# Patient Record
Sex: Female | Born: 1935 | Hispanic: No | Smoking: Never smoker
Health system: Southern US, Community
[De-identification: ages and names within clinical notes are randomized; demographics above are authoritative.]

## PROBLEM LIST (undated history)

## (undated) DIAGNOSIS — N189 Chronic kidney disease, unspecified: Secondary | ICD-10-CM

## (undated) DIAGNOSIS — I509 Heart failure, unspecified: Secondary | ICD-10-CM

## (undated) DIAGNOSIS — G459 Transient cerebral ischemic attack, unspecified: Secondary | ICD-10-CM

## (undated) DIAGNOSIS — M1990 Unspecified osteoarthritis, unspecified site: Secondary | ICD-10-CM

## (undated) DIAGNOSIS — I10 Essential (primary) hypertension: Secondary | ICD-10-CM

## (undated) DIAGNOSIS — I739 Peripheral vascular disease, unspecified: Secondary | ICD-10-CM

## (undated) DIAGNOSIS — H409 Unspecified glaucoma: Secondary | ICD-10-CM

## (undated) DIAGNOSIS — F0280 Dementia in other diseases classified elsewhere without behavioral disturbance: Secondary | ICD-10-CM

## (undated) DIAGNOSIS — E039 Hypothyroidism, unspecified: Secondary | ICD-10-CM

## (undated) DIAGNOSIS — G309 Alzheimer's disease, unspecified: Secondary | ICD-10-CM

## (undated) DIAGNOSIS — E785 Hyperlipidemia, unspecified: Secondary | ICD-10-CM

## (undated) HISTORY — PX: ABDOMINAL HYSTERECTOMY: SHX81

## (undated) HISTORY — DX: Peripheral vascular disease, unspecified: I73.9

## (undated) HISTORY — PX: APPENDECTOMY: SHX54

## (undated) HISTORY — DX: Dementia in other diseases classified elsewhere, unspecified severity, without behavioral disturbance, psychotic disturbance, mood disturbance, and anxiety: F02.80

## (undated) HISTORY — DX: Essential (primary) hypertension: I10

## (undated) HISTORY — DX: Unspecified glaucoma: H40.9

## (undated) HISTORY — DX: Hyperlipidemia, unspecified: E78.5

## (undated) HISTORY — DX: Transient cerebral ischemic attack, unspecified: G45.9

## (undated) HISTORY — DX: Chronic kidney disease, unspecified: N18.9

## (undated) HISTORY — DX: Unspecified osteoarthritis, unspecified site: M19.90

## (undated) HISTORY — DX: Alzheimer's disease, unspecified: G30.9

## (undated) HISTORY — DX: Hypothyroidism, unspecified: E03.9

## (undated) HISTORY — DX: Heart failure, unspecified: I50.9

## (undated) HISTORY — PX: BYPASS GRAFT: SHX909

---

## 2004-03-03 ENCOUNTER — Ambulatory Visit: Payer: Self-pay | Admitting: Family Medicine

## 2004-03-10 ENCOUNTER — Ambulatory Visit: Payer: Self-pay | Admitting: Family Medicine

## 2006-03-18 ENCOUNTER — Ambulatory Visit: Payer: Self-pay | Admitting: Gastroenterology

## 2006-12-04 ENCOUNTER — Ambulatory Visit: Payer: Self-pay

## 2007-03-14 ENCOUNTER — Encounter: Payer: Self-pay | Admitting: Endocrinology

## 2007-04-19 ENCOUNTER — Ambulatory Visit: Payer: Self-pay | Admitting: Family Medicine

## 2007-04-28 ENCOUNTER — Encounter: Payer: Self-pay | Admitting: Endocrinology

## 2007-05-13 ENCOUNTER — Encounter: Payer: Self-pay | Admitting: Endocrinology

## 2007-05-13 ENCOUNTER — Ambulatory Visit: Payer: Self-pay | Admitting: Family Medicine

## 2007-06-02 ENCOUNTER — Ambulatory Visit: Payer: Self-pay | Admitting: Endocrinology

## 2007-06-02 DIAGNOSIS — I10 Essential (primary) hypertension: Secondary | ICD-10-CM | POA: Insufficient documentation

## 2007-06-02 DIAGNOSIS — E039 Hypothyroidism, unspecified: Secondary | ICD-10-CM | POA: Insufficient documentation

## 2007-06-02 DIAGNOSIS — E538 Deficiency of other specified B group vitamins: Secondary | ICD-10-CM

## 2007-06-02 DIAGNOSIS — E278 Other specified disorders of adrenal gland: Secondary | ICD-10-CM | POA: Insufficient documentation

## 2007-06-17 ENCOUNTER — Encounter: Payer: Self-pay | Admitting: Endocrinology

## 2007-07-05 ENCOUNTER — Ambulatory Visit: Payer: Self-pay | Admitting: Gastroenterology

## 2007-12-06 ENCOUNTER — Ambulatory Visit: Payer: Self-pay | Admitting: Internal Medicine

## 2008-01-05 ENCOUNTER — Ambulatory Visit: Payer: Self-pay | Admitting: Internal Medicine

## 2008-02-05 ENCOUNTER — Ambulatory Visit: Payer: Self-pay | Admitting: Internal Medicine

## 2008-02-08 ENCOUNTER — Ambulatory Visit: Payer: Self-pay | Admitting: Gastroenterology

## 2008-03-06 ENCOUNTER — Ambulatory Visit: Payer: Self-pay | Admitting: Internal Medicine

## 2008-04-06 ENCOUNTER — Ambulatory Visit: Payer: Self-pay | Admitting: Internal Medicine

## 2008-05-07 ENCOUNTER — Ambulatory Visit: Payer: Self-pay | Admitting: Internal Medicine

## 2008-06-04 ENCOUNTER — Ambulatory Visit: Payer: Self-pay | Admitting: Internal Medicine

## 2008-06-06 ENCOUNTER — Ambulatory Visit: Payer: Self-pay | Admitting: Internal Medicine

## 2008-07-05 ENCOUNTER — Ambulatory Visit: Payer: Self-pay | Admitting: Internal Medicine

## 2008-07-31 ENCOUNTER — Ambulatory Visit: Payer: Self-pay | Admitting: Vascular Surgery

## 2008-08-04 ENCOUNTER — Ambulatory Visit: Payer: Self-pay | Admitting: Internal Medicine

## 2008-09-04 ENCOUNTER — Ambulatory Visit: Payer: Self-pay | Admitting: Internal Medicine

## 2008-10-04 ENCOUNTER — Ambulatory Visit: Payer: Self-pay | Admitting: Internal Medicine

## 2008-11-04 ENCOUNTER — Ambulatory Visit: Payer: Self-pay | Admitting: Internal Medicine

## 2008-12-05 ENCOUNTER — Ambulatory Visit: Payer: Self-pay | Admitting: Internal Medicine

## 2009-01-04 ENCOUNTER — Ambulatory Visit: Payer: Self-pay | Admitting: Internal Medicine

## 2009-02-04 ENCOUNTER — Ambulatory Visit: Payer: Self-pay | Admitting: Internal Medicine

## 2009-03-13 ENCOUNTER — Ambulatory Visit: Payer: Self-pay | Admitting: Internal Medicine

## 2009-04-06 ENCOUNTER — Ambulatory Visit: Payer: Self-pay | Admitting: Internal Medicine

## 2009-05-07 ENCOUNTER — Ambulatory Visit: Payer: Self-pay | Admitting: Internal Medicine

## 2009-06-04 ENCOUNTER — Ambulatory Visit: Payer: Self-pay | Admitting: Internal Medicine

## 2009-06-05 ENCOUNTER — Ambulatory Visit: Payer: Self-pay | Admitting: Internal Medicine

## 2009-06-18 ENCOUNTER — Ambulatory Visit: Payer: Self-pay | Admitting: Internal Medicine

## 2009-07-05 ENCOUNTER — Ambulatory Visit: Payer: Self-pay | Admitting: Internal Medicine

## 2009-08-04 ENCOUNTER — Ambulatory Visit: Payer: Self-pay | Admitting: Internal Medicine

## 2009-09-04 ENCOUNTER — Ambulatory Visit: Payer: Self-pay | Admitting: Internal Medicine

## 2009-10-04 ENCOUNTER — Ambulatory Visit: Payer: Self-pay | Admitting: Internal Medicine

## 2009-11-04 ENCOUNTER — Ambulatory Visit: Payer: Self-pay | Admitting: Internal Medicine

## 2009-12-05 ENCOUNTER — Ambulatory Visit: Payer: Self-pay | Admitting: Internal Medicine

## 2010-01-09 ENCOUNTER — Ambulatory Visit: Payer: Self-pay | Admitting: Ophthalmology

## 2010-04-20 ENCOUNTER — Inpatient Hospital Stay: Payer: Self-pay | Admitting: Internal Medicine

## 2010-04-24 ENCOUNTER — Encounter: Payer: Self-pay | Admitting: Internal Medicine

## 2010-05-07 ENCOUNTER — Encounter: Payer: Self-pay | Admitting: Internal Medicine

## 2010-06-05 ENCOUNTER — Ambulatory Visit: Payer: Self-pay | Admitting: Internal Medicine

## 2010-07-06 ENCOUNTER — Ambulatory Visit: Payer: Self-pay | Admitting: Internal Medicine

## 2010-08-28 ENCOUNTER — Ambulatory Visit: Payer: Self-pay | Admitting: Internal Medicine

## 2010-09-05 ENCOUNTER — Ambulatory Visit: Payer: Self-pay | Admitting: Internal Medicine

## 2010-12-30 ENCOUNTER — Ambulatory Visit: Payer: Self-pay | Admitting: Internal Medicine

## 2011-01-05 ENCOUNTER — Ambulatory Visit: Payer: Self-pay | Admitting: Internal Medicine

## 2011-07-15 ENCOUNTER — Inpatient Hospital Stay: Payer: Self-pay | Admitting: Internal Medicine

## 2011-07-15 LAB — URINALYSIS, COMPLETE
Bacteria: NONE SEEN
Hyaline Cast: 1
Ketone: NEGATIVE
Nitrite: NEGATIVE
Ph: 5 (ref 4.5–8.0)
Specific Gravity: 1.013 (ref 1.003–1.030)
WBC UR: 9 /HPF (ref 0–5)

## 2011-07-15 LAB — CBC
HGB: 11.8 g/dL — ABNORMAL LOW (ref 12.0–16.0)
MCH: 31.3 pg (ref 26.0–34.0)
MCHC: 33.6 g/dL (ref 32.0–36.0)
RBC: 3.77 10*6/uL — ABNORMAL LOW (ref 3.80–5.20)
RDW: 14.6 % — ABNORMAL HIGH (ref 11.5–14.5)
WBC: 3.9 10*3/uL (ref 3.6–11.0)

## 2011-07-15 LAB — COMPREHENSIVE METABOLIC PANEL
Albumin: 3.9 g/dL (ref 3.4–5.0)
Anion Gap: 13 (ref 7–16)
BUN: 63 mg/dL — ABNORMAL HIGH (ref 7–18)
Bilirubin,Total: 0.3 mg/dL (ref 0.2–1.0)
Creatinine: 2.59 mg/dL — ABNORMAL HIGH (ref 0.60–1.30)
EGFR (Non-African Amer.): 18 — ABNORMAL LOW
Glucose: 114 mg/dL — ABNORMAL HIGH (ref 65–99)
Potassium: 3.4 mmol/L — ABNORMAL LOW (ref 3.5–5.1)
SGOT(AST): 22 U/L (ref 15–37)
Sodium: 141 mmol/L (ref 136–145)

## 2011-07-15 LAB — TROPONIN I: Troponin-I: <0.02 ng/mL

## 2011-07-15 LAB — CK TOTAL AND CKMB (NOT AT ARMC)
CK, Total: 69 U/L
CK-MB: 3.8 ng/mL — ABNORMAL HIGH

## 2011-07-15 LAB — T4, FREE: Free Thyroxine: 0.92 ng/dL (ref 0.76–1.46)

## 2011-07-15 LAB — TSH: Thyroid Stimulating Horm: 21 u[IU]/mL — ABNORMAL HIGH

## 2011-07-16 DIAGNOSIS — I059 Rheumatic mitral valve disease, unspecified: Secondary | ICD-10-CM

## 2011-07-16 LAB — URINALYSIS, COMPLETE
Bacteria: NONE SEEN
Glucose,UR: NEGATIVE mg/dL (ref 0–75)
Ketone: NEGATIVE
Nitrite: NEGATIVE
Ph: 5 (ref 4.5–8.0)
RBC,UR: 22 /HPF (ref 0–5)
Specific Gravity: 1.008 (ref 1.003–1.030)
Squamous Epithelial: <1

## 2011-07-16 LAB — LIPID PANEL
HDL Cholesterol: 45 mg/dL (ref 40–60)
Triglycerides: 165 mg/dL (ref 0–200)

## 2011-07-17 LAB — BASIC METABOLIC PANEL
Anion Gap: 13 (ref 7–16)
BUN: 57 mg/dL — ABNORMAL HIGH (ref 7–18)
Calcium, Total: 8.4 mg/dL — ABNORMAL LOW (ref 8.5–10.1)
Creatinine: 2.46 mg/dL — ABNORMAL HIGH (ref 0.60–1.30)
EGFR (African American): 19 — ABNORMAL LOW
EGFR (Non-African Amer.): 17 — ABNORMAL LOW
Osmolality: 299 (ref 275–301)
Potassium: 3.8 mmol/L (ref 3.5–5.1)

## 2011-07-18 LAB — BASIC METABOLIC PANEL
Anion Gap: 12 (ref 7–16)
BUN: 60 mg/dL — ABNORMAL HIGH (ref 7–18)
Calcium, Total: 8.4 mg/dL — ABNORMAL LOW (ref 8.5–10.1)
Chloride: 106 mmol/L (ref 98–107)
Co2: 19 mmol/L — ABNORMAL LOW (ref 21–32)
Creatinine: 2.93 mg/dL — ABNORMAL HIGH (ref 0.60–1.30)
EGFR (African American): 16 — ABNORMAL LOW
EGFR (Non-African Amer.): 14 — ABNORMAL LOW
Glucose: 112 mg/dL — ABNORMAL HIGH (ref 65–99)
Osmolality: 291 (ref 275–301)
Potassium: 4.1 mmol/L (ref 3.5–5.1)
Sodium: 137 mmol/L (ref 136–145)

## 2011-07-19 LAB — BASIC METABOLIC PANEL
Anion Gap: 12 (ref 7–16)
BUN: 55 mg/dL — ABNORMAL HIGH (ref 7–18)
Calcium, Total: 8.4 mg/dL — ABNORMAL LOW (ref 8.5–10.1)
EGFR (Non-African Amer.): 15 — ABNORMAL LOW
Osmolality: 289 (ref 275–301)

## 2011-07-19 LAB — PHOSPHORUS: Phosphorus: 3.1 mg/dL (ref 2.5–4.9)

## 2011-07-19 LAB — PLATELET COUNT: Platelet: 256 10*3/uL (ref 150–440)

## 2011-07-20 LAB — BASIC METABOLIC PANEL
Anion Gap: 10 (ref 7–16)
BUN: 46 mg/dL — ABNORMAL HIGH (ref 7–18)
Calcium, Total: 8.5 mg/dL (ref 8.5–10.1)
Chloride: 103 mmol/L (ref 98–107)
Co2: 18 mmol/L — ABNORMAL LOW (ref 21–32)
EGFR (African American): 20 — ABNORMAL LOW
EGFR (Non-African Amer.): 17 — ABNORMAL LOW
Glucose: 125 mg/dL — ABNORMAL HIGH (ref 65–99)

## 2011-07-20 LAB — CBC WITH DIFFERENTIAL/PLATELET
Basophil %: 0.4 %
Eosinophil %: 1.2 %
Lymphocyte #: 0.4 10*3/uL — ABNORMAL LOW (ref 1.0–3.6)
MCHC: 32.3 g/dL (ref 32.0–36.0)
MCV: 94 fL (ref 80–100)
Monocyte #: 0.5 x10 3/mm (ref 0.2–0.9)
Monocyte %: 9.3 %
Neutrophil %: 82.3 %
Platelet: 269 10*3/uL (ref 150–440)
RBC: 3.48 10*6/uL — ABNORMAL LOW (ref 3.80–5.20)
RDW: 14.3 % (ref 11.5–14.5)
WBC: 5.4 10*3/uL (ref 3.6–11.0)

## 2011-07-21 LAB — UR PROT ELECTROPHORESIS, URINE RANDOM

## 2011-12-18 ENCOUNTER — Inpatient Hospital Stay: Payer: Self-pay | Admitting: Student

## 2011-12-18 LAB — BASIC METABOLIC PANEL
Anion Gap: 6 — ABNORMAL LOW (ref 7–16)
BUN: 37 mg/dL — ABNORMAL HIGH (ref 7–18)
Calcium, Total: 8.9 mg/dL (ref 8.5–10.1)
Chloride: 109 mmol/L — ABNORMAL HIGH (ref 98–107)
Co2: 24 mmol/L (ref 21–32)
Creatinine: 2.11 mg/dL — ABNORMAL HIGH (ref 0.60–1.30)
Potassium: 5.2 mmol/L — ABNORMAL HIGH (ref 3.5–5.1)
Sodium: 139 mmol/L (ref 136–145)

## 2011-12-18 LAB — CBC
HGB: 12.1 g/dL (ref 12.0–16.0)
MCH: 31.1 pg (ref 26.0–34.0)
MCV: 94 fL (ref 80–100)
Platelet: 337 10*3/uL (ref 150–440)
RBC: 3.89 10*6/uL (ref 3.80–5.20)
RDW: 15.2 % — ABNORMAL HIGH (ref 11.5–14.5)
WBC: 5.4 10*3/uL (ref 3.6–11.0)

## 2011-12-18 LAB — CK TOTAL AND CKMB (NOT AT ARMC)
CK, Total: 75 U/L (ref 21–215)
CK-MB: 4.7 ng/mL — ABNORMAL HIGH (ref 0.5–3.6)

## 2011-12-18 LAB — DIFFERENTIAL
Eosinophil %: 1.3 %
Monocyte %: 6 %
Neutrophil #: 4.7 10*3/uL (ref 1.4–6.5)
Neutrophil %: 85 %

## 2011-12-18 LAB — HEPATIC FUNCTION PANEL A (ARMC)
Albumin: 3.5 g/dL (ref 3.4–5.0)
Alkaline Phosphatase: 94 U/L (ref 50–136)
Bilirubin, Direct: <0.1 mg/dL (ref 0.00–0.20)
Bilirubin,Total: 0.2 mg/dL (ref 0.2–1.0)

## 2011-12-19 LAB — MAGNESIUM: Magnesium: 2.1 mg/dL

## 2011-12-19 LAB — CBC WITH DIFFERENTIAL/PLATELET
Basophil #: 0.1 10*3/uL (ref 0.0–0.1)
Eosinophil #: 0.1 10*3/uL (ref 0.0–0.7)
Lymphocyte %: 8.7 %
MCHC: 33.6 g/dL (ref 32.0–36.0)
Monocyte %: 7.3 %
Neutrophil #: 3.8 10*3/uL (ref 1.4–6.5)
Neutrophil %: 79.7 %
Platelet: 281 10*3/uL (ref 150–440)
RBC: 3.16 10*6/uL — ABNORMAL LOW (ref 3.80–5.20)
RDW: 15.1 % — ABNORMAL HIGH (ref 11.5–14.5)

## 2011-12-19 LAB — CK TOTAL AND CKMB (NOT AT ARMC): CK-MB: 3.8 ng/mL — ABNORMAL HIGH (ref 0.5–3.6)

## 2011-12-19 LAB — BASIC METABOLIC PANEL
Anion Gap: 9 (ref 7–16)
Chloride: 110 mmol/L — ABNORMAL HIGH (ref 98–107)
Co2: 20 mmol/L — ABNORMAL LOW (ref 21–32)
Creatinine: 2.08 mg/dL — ABNORMAL HIGH (ref 0.60–1.30)
EGFR (African American): 24 — ABNORMAL LOW
Osmolality: 287 (ref 275–301)
Potassium: 5.5 mmol/L — ABNORMAL HIGH (ref 3.5–5.1)

## 2011-12-19 LAB — TROPONIN I: Troponin-I: <0.02 ng/mL

## 2011-12-20 LAB — BASIC METABOLIC PANEL
BUN: 38 mg/dL — ABNORMAL HIGH (ref 7–18)
Calcium, Total: 8.3 mg/dL — ABNORMAL LOW (ref 8.5–10.1)
Chloride: 109 mmol/L — ABNORMAL HIGH (ref 98–107)
EGFR (African American): 24 — ABNORMAL LOW
EGFR (Non-African Amer.): 21 — ABNORMAL LOW
Osmolality: 285 (ref 275–301)
Sodium: 139 mmol/L (ref 136–145)

## 2011-12-22 LAB — BASIC METABOLIC PANEL
BUN: 50 mg/dL — ABNORMAL HIGH (ref 7–18)
Calcium, Total: 8.3 mg/dL — ABNORMAL LOW (ref 8.5–10.1)
Co2: 25 mmol/L (ref 21–32)
Creatinine: 2.17 mg/dL — ABNORMAL HIGH (ref 0.60–1.30)
Sodium: 140 mmol/L (ref 136–145)

## 2011-12-30 ENCOUNTER — Encounter: Payer: Self-pay | Admitting: *Deleted

## 2012-01-06 ENCOUNTER — Ambulatory Visit (INDEPENDENT_AMBULATORY_CARE_PROVIDER_SITE_OTHER): Payer: Medicare Other | Admitting: Cardiovascular Disease

## 2012-01-06 ENCOUNTER — Encounter: Payer: Self-pay | Admitting: Cardiovascular Disease

## 2012-01-06 VITALS — BP 138/50 | HR 68 | Ht 67.0 in | Wt 137.2 lb

## 2012-01-06 DIAGNOSIS — I10 Essential (primary) hypertension: Secondary | ICD-10-CM

## 2012-01-06 DIAGNOSIS — I4892 Unspecified atrial flutter: Secondary | ICD-10-CM | POA: Insufficient documentation

## 2012-01-06 DIAGNOSIS — N186 End stage renal disease: Secondary | ICD-10-CM

## 2012-01-06 DIAGNOSIS — I509 Heart failure, unspecified: Secondary | ICD-10-CM

## 2012-01-06 DIAGNOSIS — R609 Edema, unspecified: Secondary | ICD-10-CM

## 2012-01-06 DIAGNOSIS — I5033 Acute on chronic diastolic (congestive) heart failure: Secondary | ICD-10-CM | POA: Insufficient documentation

## 2012-01-06 MED ORDER — HYDRALAZINE HCL 50 MG PO TABS: 50.0000 mg | ORAL_TABLET | Freq: Four times a day (QID) | ORAL | Status: DC

## 2012-01-06 MED ORDER — FUROSEMIDE 40 MG PO TABS: 40.0000 mg | ORAL_TABLET | Freq: Two times a day (BID) | ORAL | Status: DC | PRN

## 2012-01-06 NOTE — Assessment & Plan Note (Signed)
Baseline creatinine 2 or higher. She reports she was told she is not a candidate for hemodialysis.

## 2012-01-06 NOTE — Assessment & Plan Note (Signed)
Recent admission to the hospital with diuresis. She continues to have at least mild if not mild to moderate pleural effusions bilaterally causing shortness of breath. We have suggested she stay on Lasix 40 mg daily with extra Lasix in the afternoon for any weight gain or worsening shortness of breath.

## 2012-01-06 NOTE — Assessment & Plan Note (Signed)
EKG from the hospital 12/18/2011 concerning for atrial flutter.  This could potentially be the cause of her admission to the hospital and heart failure. She appears to be in normal sinus rhythm on today's visit. We'll need to watch her rhythm closely after holding diltiazem. If she has additional arrhythmia or concerns for arrhythmia, we could use beta blocker or antiarrhythmic medication such as amiodarone.

## 2012-01-06 NOTE — Patient Instructions (Addendum)
Please hold amlodipine and diltiazem (for leg swelling) Please increase hydralazine to 1/2 pill four times a day (25 mg) If blood pressure runs high, increase to a full hydralazine 50 mg four times day  Please take extra lasix for weight gain of three pounds or worsening shortness of breath, take extra lasix after lunch  Please call us if you have new issues that need to be addressed before your next appt.  Your physician wants you to follow-up in: 3 weeks  You will receive a reminder letter in the mail two months in advance. If you don't receive a letter, please call our office to schedule the follow-up appointment.

## 2012-01-06 NOTE — Progress Notes (Signed)
Patient ID: Erin Horton, female    DOB: [birth year], 78 y.o.   MRN: [id number]  HPI Comments: Erin Horton is a pleasant 78 year old woman with chronic kidney disease, stage IV, dementia, history of TIA, peripheral vascular disease with right lower extremity bypass grafting with recent hospital admission 12/18/2011 to [facility] with acute on chronic diastolic CHF in the setting of worsening renal failure. She presents for new patient evaluation. She seen by Dr. Wynelle Link.  She reports having chronic severe lower extreme edema over the past month or so. Also with worsening shortness of breath. In the hospital, she was found to have mild to moderate bilateral pleural effusions she had significant diuresis in the hospital, approximately 6 L or more. Renal function was essentially stable through this.   Baseline creatinine 2.0, BNP in the hospital was 1400 Echocardiogram in the hospital showed ejection fraction 55% , diastolic dysfunction Chest x-ray in the hospital showed mild-to-moderate pulmonary effusions bilaterally  EKG in the hospital 12/18/2011 appears to show atrial flutter with ventricular rate 73 beats per minute  EKG shows normal sinus rhythm with rate 68 beats per minute, no significant ST or T wave changes, poor R-wave progression through the anterior precordial leads      Outpatient Encounter Prescriptions as of 01/06/2012  Medication Sig Dispense Refill  . ALPRAZolam (XANAX) 0.25 MG tablet Take 0.25 mg by mouth at bedtime.      Marland Kitchen aspirin 81 MG tablet Take 81 mg by mouth daily.      . carvedilol (COREG) 3.125 MG tablet Take 3.125 mg by mouth 2 (two) times daily with a meal.      . diphenhydramine-acetaminophen (TYLENOL PM) 25-500 MG TABS Take 2 tablets by mouth as needed.      Marland Kitchen levothyroxine (SYNTHROID, LEVOTHROID) 75 MCG tablet 75 mcg every other day.      . Melatonin 5 MG TABS Take by mouth at bedtime.      . NON FORMULARY Oxygen 3-4 liters.      Marland Kitchen RIVASTIGMINE TD Place onto the skin.       . sodium bicarbonate 650 MG tablet Take 650 mg by mouth 3 (three) times daily.      Marland Kitchen amLODipine (NORVASC) 5 MG tablet Take 5 mg by mouth daily.      Marland Kitchen  diltiazem (CARDIZEM CD) 180 MG 24 hr capsule Take 180 mg by mouth daily.      . furosemide (LASIX) 40 MG tablet Take 40 mg by mouth daily.      .  hydrALAZINE (APRESOLINE) 10 MG tablet Take 10 mg by mouth 4 (four) times daily.        Review of Systems  Constitutional: Negative.   HENT: Negative.   Eyes: Negative.   Respiratory: Positive for shortness of breath.   Cardiovascular: Positive for leg swelling.  Gastrointestinal: Negative.   Musculoskeletal: Positive for gait problem.  Skin: Negative.   Neurological: Negative.   Hematological: Negative.   Psychiatric/Behavioral: Negative.   All other systems reviewed and are negative.    BP 138/50  Pulse 68  Ht 5\' 7"  (1.702 m)  Wt 137 lb 4 oz (62.256 kg)  BMI 21.50 kg/m2  Physical Exam  Nursing note and vitals reviewed. Constitutional: She is oriented to person, place, and time. She appears well-developed and well-nourished.       Sitting in a wheelchair, appears short of breath  HENT:  Head: Normocephalic.  Nose: Nose normal.  Mouth/Throat: Oropharynx is clear and moist.  Eyes: Conjunctivae  normal are normal. Pupils are equal, round, and reactive to light.  Neck: Normal range of motion. Neck supple. No JVD present.  Cardiovascular: Normal rate, regular rhythm, S1 normal, S2 normal, normal heart sounds and intact distal pulses.  Exam reveals no gallop and no friction rub.   No murmur heard.      2+ pitting edema to the thighs with compression hose in place  Pulmonary/Chest: Effort normal. No respiratory distress. She has decreased breath sounds in the right lower field and the left lower field. She has no wheezes. She has no rales. She exhibits no tenderness.  Abdominal: Soft. Bowel sounds are normal. She exhibits no distension. There is no tenderness.  Musculoskeletal:  Normal range of motion. She exhibits edema. She exhibits no tenderness.  Lymphadenopathy:    She has no cervical adenopathy.  Neurological: She is alert and oriented to person, place, and time. Coordination normal.  Skin: Skin is warm and dry. No rash noted. No erythema.  Psychiatric: She has a normal mood and affect. Her behavior is normal. Judgment and thought content normal.         Assessment and Plan

## 2012-01-06 NOTE — Assessment & Plan Note (Signed)
For blood pressure we will hold the diltiazem and amlodipine. We will increase the hydralazine initially to 25 mg 4 times a day. This can be increased to 50 mg 4 times a day to maintain systolic pressure less than 140.

## 2012-01-06 NOTE — Assessment & Plan Note (Signed)
Edema likely secondary to fluid overload from diastolic CHF and underlying renal dysfunction. Possible exacerbation from calcium channel blocker. We will hold the amlodipine and diltiazem to see if her edema will improve.

## 2012-01-28 ENCOUNTER — Encounter: Payer: Self-pay | Admitting: Cardiovascular Disease

## 2012-01-28 ENCOUNTER — Ambulatory Visit (INDEPENDENT_AMBULATORY_CARE_PROVIDER_SITE_OTHER): Payer: Medicare Other | Admitting: Cardiovascular Disease

## 2012-01-28 VITALS — BP 197/69 | HR 58 | Ht 66.0 in | Wt 122.1 lb

## 2012-01-28 DIAGNOSIS — R609 Edema, unspecified: Secondary | ICD-10-CM

## 2012-01-28 DIAGNOSIS — N186 End stage renal disease: Secondary | ICD-10-CM

## 2012-01-28 DIAGNOSIS — I10 Essential (primary) hypertension: Secondary | ICD-10-CM

## 2012-01-28 DIAGNOSIS — R0602 Shortness of breath: Secondary | ICD-10-CM

## 2012-01-28 DIAGNOSIS — I4892 Unspecified atrial flutter: Secondary | ICD-10-CM

## 2012-01-28 DIAGNOSIS — I5033 Acute on chronic diastolic (congestive) heart failure: Secondary | ICD-10-CM

## 2012-01-28 DIAGNOSIS — I509 Heart failure, unspecified: Secondary | ICD-10-CM

## 2012-01-28 MED ORDER — CARVEDILOL 3.125 MG PO TABS: 3.1250 mg | ORAL_TABLET | Freq: Two times a day (BID) | ORAL | Status: DC

## 2012-01-28 MED ORDER — HYDRALAZINE HCL 100 MG PO TABS: 100.0000 mg | ORAL_TABLET | Freq: Four times a day (QID) | ORAL | Status: DC

## 2012-01-28 NOTE — Progress Notes (Signed)
Patient ID: Erin Horton, female    DOB: [birth year], 78 y.o.   MRN: [id number]  HPI Comments: Erin Horton is a pleasant 78 year old woman with chronic kidney disease, stage IV, dementia, history of TIA, peripheral vascular disease with right lower extremity bypass grafting with recent hospital admission 12/18/2011 to [facility] with acute on chronic diastolic CHF in the setting of worsening renal failure, atrial flutter.   She reports having chronic severe lower extreme edema over the past month or so. Also with worsening shortness of breath. In the hospital, she was found to have mild to moderate bilateral pleural effusions she had significant diuresis in the hospital, approximately 6 L or more. Renal function was essentially stable through this.   Baseline creatinine 2.0, BNP in the hospital was 1400 Echocardiogram in the hospital showed ejection fraction 55% , diastolic dysfunction Chest x-ray in the hospital showed mild-to-moderate pulmonary effusions bilaterally  On her last clinic visit, he held amlodipine and diltiazem on the assumption that it was causing side effects of worsening lower extremity edema. She was started on Lasix 40 mg daily. In followup today after 3 weeks, she reports her edema has significantly improved, she has had dramatic weight loss and feels better. Her caretaker and family that come with her today reports she is doing much better since she continues to have significant edema below the knees and her ankles, shortness of breath and edema in her thighs and abdomen has improved. Edema in her arms has also improved.    EKG in the hospital 12/18/2011 appears to show atrial flutter with ventricular rate 73 beats per minute EKG today shows normal sinus rhythm with rate 62 beats per minute with no significant ST or T wave changes     Outpatient Encounter Prescriptions as of 01/28/2012  Medication Sig Dispense Refill  . ALPRAZolam (XANAX) 0.25 MG tablet Take 0.25 mg by mouth at  bedtime.      Marland Kitchen aspirin 81 MG tablet Take 81 mg by mouth daily.      . carvedilol (COREG) 3.125 MG tablet Take 1 tablet (3.125 mg total) by mouth 2 (two) times daily with a meal.  180 tablet  3  . furosemide (LASIX) 40 MG tablet Take 1 tablet (40 mg total) by mouth 2 (two) times daily as needed.  60 tablet  6  . levothyroxine (SYNTHROID, LEVOTHROID) 75 MCG tablet 75 mcg every other day.      . Melatonin 5 MG TABS Take by mouth at bedtime.      . NON FORMULARY Oxygen 3-4 liters at bedtime.      Marland Kitchen RIVASTIGMINE TD Place onto the skin.      . sodium bicarbonate 650 MG tablet Take 650 mg by mouth 3 (three) times daily.      .  hydrALAZINE (APRESOLINE) 50 MG tablet Take 1 tablet (50 mg total) by mouth 4 (four) times daily.  120 tablet  6    Review of Systems  Constitutional: Negative.   HENT: Negative.   Eyes: Negative.   Cardiovascular: Positive for leg swelling.  Gastrointestinal: Negative.   Musculoskeletal: Positive for gait problem.  Skin: Negative.   Neurological: Negative.   Hematological: Negative.   Psychiatric/Behavioral: Negative.   All other systems reviewed and are negative.    BP 197/69  Pulse 58  Ht 5\' 6"  (1.676 m)  Wt 122 lb 2 oz (55.396 kg)  BMI 19.71 kg/m2 Repeat blood pressure with normal cuff size blood pressure is 140/70 Physical Exam  Nursing note and vitals reviewed. Constitutional: She is oriented to person, place, and time. She appears well-developed and well-nourished.       Sitting in a wheelchair, appears short of breath  HENT:  Head: Normocephalic.  Nose: Nose normal.  Mouth/Throat: Oropharynx is clear and moist.  Eyes: Conjunctivae normal are normal. Pupils are equal, round, and reactive to light.  Neck: Normal range of motion. Neck supple. No JVD present.  Cardiovascular: Normal rate, regular rhythm, S1 normal, S2 normal, normal heart sounds and intact distal pulses.  Exam reveals no gallop and no friction rub.   No murmur heard.      1+ pitting  edema to  below the knees with compression hose in place  Pulmonary/Chest: Effort normal. No respiratory distress. She has decreased breath sounds in the right lower field and the left lower field. She has no wheezes. She has no rales. She exhibits no tenderness.  Abdominal: Soft. Bowel sounds are normal. She exhibits no distension. There is no tenderness.  Musculoskeletal: Normal range of motion. She exhibits edema. She exhibits no tenderness.  Lymphadenopathy:    She has no cervical adenopathy.  Neurological: She is alert and oriented to person, place, and time. Coordination normal.  Skin: Skin is warm and dry. No rash noted. No erythema.  Psychiatric: She has a normal mood and affect. Her behavior is normal. Judgment and thought content normal.         Assessment and Plan

## 2012-01-28 NOTE — Patient Instructions (Addendum)
You are doing well. Please increase the hydralazine to 100 mg four times a day  We will check blood work today  Please call us if you have new issues that need to be addressed before your next appt.  Your physician wants you to follow-up in: 2 months.

## 2012-01-28 NOTE — Assessment & Plan Note (Signed)
Significant improvement in her edema and shortness of breath. We will check a basic metabolic panel today, continue Lasix 40 mg daily for now. We will adjust her Lasix based on her lab work. I suggested that she continue to follow her weight and call our office for additional significant weight drop.

## 2012-01-28 NOTE — Assessment & Plan Note (Signed)
Edema slowly improving. Will refrain from using calcium channel blockers as her edema has improved without disease. We'll continue  Lasix with basic metabolic panel today to help guide diuresis.

## 2012-01-28 NOTE — Assessment & Plan Note (Signed)
Maintaining normal sinus rhythm. Continue low-dose beta blocker

## 2012-01-28 NOTE — Assessment & Plan Note (Signed)
Basic metabolic panel scheduled for today. Prior creatinine of 2.

## 2012-01-28 NOTE — Assessment & Plan Note (Signed)
We will increase the hydralazine to 100 mg 4 times a day. Blood pressure continues to be elevated, clonidine could be added at a later date

## 2012-01-29 LAB — BASIC METABOLIC PANEL
CO2: 24 mmol/L (ref 19–28)
Calcium: 8.9 mg/dL (ref 8.6–10.2)
Creatinine, Ser: 2.03 mg/dL — ABNORMAL HIGH (ref 0.57–1.00)
Glucose: 98 mg/dL (ref 65–99)
Potassium: 4.1 mmol/L (ref 3.5–5.2)
Sodium: 139 mmol/L (ref 134–144)

## 2012-02-10 ENCOUNTER — Telehealth: Payer: Self-pay | Admitting: Cardiovascular Disease

## 2012-02-10 NOTE — Telephone Encounter (Signed)
Pt daughter calling wants to know what OTC meds she can take for a cold

## 2012-02-10 NOTE — Telephone Encounter (Signed)
Advised ok to take benadryl, robitussin (not robitussin D or DM), plain Mucinex (D or DM) or nasal spray. Advised to stay away from decongestants. Understanding verb.

## 2012-02-24 NOTE — Telephone Encounter (Signed)
I spoke with pt's dtr-in-law She says pt is feeling better re: cold but is now having issues with dizziness when standing and says pt actually fell a few days ago She has a HH aid that comes to stay with pt some during the week and aide reported this to family They do not check BP at home, but when pt is out at store, BP has been 145/57, 161/66 I advised to have pt come in for orthostatic BPs but dtr in law says it is very difficult to bring pt in  She asks if we can just adjust meds I explained BP sounds high when checked but it may be that BP is dropping with position changes, hard to tell without bringing pt in She says pt has lost 4 pounds since last visit. Weight is now down to 116 pounds (from 120 pounds), denies edema, says she is "skin and bones" It may be that pt is dry Confirms compliance withy lasix 40 mg daily (takes extra dose at hs if edema or weight gain, but has not had to have extra dose) Confirms compliance with hydralazine QID I advised to try decreasing hydralazine to TID to see if this helps symptoms If it does not, she may try decreasing lasix to 1/2 tablet (20 mg) daily to see if this helps In the meantime, she should monitor pt's BP and symptoms and will call us by Friday to give [place] an update If no better, dtr-in-law is willing to bring pt in for orthostatic BP check

## 2012-02-24 NOTE — Telephone Encounter (Signed)
fyi  See below

## 2012-02-24 NOTE — Telephone Encounter (Signed)
Pt daughter in law calling stating that pt is dizzy and not steady on her feet when standing up.

## 2012-02-24 NOTE — Telephone Encounter (Signed)
She could hold the Lasix for a few days until she feels better If she is not well, she may not be eating and drinking as much

## 2012-02-25 NOTE — Telephone Encounter (Signed)
I spoke with Erin Horton again (dtr in law) She says she spoke with pt's caregiver this am and pt is feeling much better, denying dizziness this am (decreased hydralazine to TID) She will continue to stay with pt and monitor symptoms/BP and will call us tomm with report I explained Dr. Mariah Milling is ok with her holding lasix a few days if needed, but Elease Hashimoto does not feel this is necessary at this time but will keep [place] informed

## 2012-03-23 ENCOUNTER — Ambulatory Visit (INDEPENDENT_AMBULATORY_CARE_PROVIDER_SITE_OTHER): Payer: Medicare Other | Admitting: Cardiovascular Disease

## 2012-03-23 ENCOUNTER — Encounter: Payer: Self-pay | Admitting: Cardiovascular Disease

## 2012-03-23 VITALS — BP 150/50 | HR 62 | Ht 67.0 in | Wt 105.0 lb

## 2012-03-23 DIAGNOSIS — I4892 Unspecified atrial flutter: Secondary | ICD-10-CM

## 2012-03-23 DIAGNOSIS — N186 End stage renal disease: Secondary | ICD-10-CM

## 2012-03-23 DIAGNOSIS — R609 Edema, unspecified: Secondary | ICD-10-CM

## 2012-03-23 DIAGNOSIS — I5033 Acute on chronic diastolic (congestive) heart failure: Secondary | ICD-10-CM

## 2012-03-23 DIAGNOSIS — I509 Heart failure, unspecified: Secondary | ICD-10-CM

## 2012-03-23 DIAGNOSIS — N19 Unspecified kidney failure: Secondary | ICD-10-CM

## 2012-03-23 NOTE — Assessment & Plan Note (Signed)
Minimal edema around the ankles. Much improved.

## 2012-03-23 NOTE — Assessment & Plan Note (Signed)
Maintaining normal sinus rhythm today. No changes to her medications.

## 2012-03-23 NOTE — Patient Instructions (Addendum)
You are doing well.  Hold lasix for weight less than 100 We will draw blood today Decrease lasix to 1/2 pill every other day  Please call us if you have new issues that need to be addressed before your next appt.  Your physician wants you to follow-up in: 6 months.  You will receive a reminder letter in the mail two months in advance. If you don't receive a letter, please call our office to schedule the follow-up appointment.

## 2012-03-23 NOTE — Progress Notes (Signed)
Patient ID: Erin Horton, female    DOB: [birth year], 78 y.o.   MRN: [id number]  HPI Comments: Erin Horton is a pleasant 78 year old woman with chronic kidney disease, stage IV, dementia, history of TIA, peripheral vascular disease with right lower extremity bypass grafting with  hospital admission 12/18/2011 to [facility] with acute on chronic diastolic CHF in the setting of worsening renal failure, atrial flutter.   Previously, she had chronic severe lower extreme edema for several months Also with worsening shortness of breath. In the hospital, she was found to have mild to moderate bilateral pleural effusions she had significant diuresis in the hospital, approximately 6 L or more. Renal function was essentially stable through this.   Baseline creatinine 2.0, BNP in the hospital was 1400 Echocardiogram in the hospital showed ejection fraction 55% , diastolic dysfunction Chest x-ray in the hospital showed mild-to-moderate pulmonary effusions bilaterally  On a previous office visit we held amlodipine and diltiazem on the assumption that it was causing side effects of worsening lower extremity edema. She was started on Lasix 40 mg daily. In followup 3 weeks later,  her edema had significantly improved, she has had dramatic weight loss and feels better.  Edema in her arms has also improved.  Her weight has decreased from 137 pounds, to 122 pounds on her last clinic visit, now down to 105 pounds. She feels well overall. Her weight at home was 98-100 pounds. She continues to take Lasix 40 mg daily. She did have occasional dizziness from low blood pressures and she decreased her hydralazine to 3 times a day from 4 times a day. Caretaker reports that she is very active, doing some exercises, ambulating.  Previous EKG in the hospital 12/18/2011 appears to show atrial flutter with ventricular rate 73 beats per minute EKG on last office visit showed normal sinus rhythm with rate 62 beats per minute with no  significant ST or T wave changes EKG today shows normal sinus rhythm with rate 62 beats per minute with no significant ST or T wave changes     Outpatient Encounter Prescriptions as of 03/23/2012  Medication Sig Dispense Refill  . ALPRAZolam (XANAX) 0.25 MG tablet Take 0.25 mg by mouth at bedtime.      Marland Kitchen aspirin 81 MG tablet Take 81 mg by mouth daily.      . carvedilol (COREG) 3.125 MG tablet Take 1 tablet (3.125 mg total) by mouth 2 (two) times daily with a meal.  180 tablet  3  . furosemide (LASIX) 40 MG tablet Take 40 mg by mouth daily.      . hydrALAZINE (APRESOLINE) 100 MG tablet Take 100 mg by mouth 3 (three) times daily.      Marland Kitchen levothyroxine (SYNTHROID, LEVOTHROID) 75 MCG tablet Take 75 mcg by mouth daily.       . Melatonin 5 MG TABS Take by mouth at bedtime.      . NON FORMULARY Oxygen 3-4 liters at bedtime.      . sodium bicarbonate 650 MG tablet Take 650 mg by mouth 3 (three) times daily.        Review of Systems  Constitutional: Negative.   HENT: Negative.   Eyes: Negative.   Gastrointestinal: Negative.   Musculoskeletal: Positive for gait problem.  Skin: Negative.   Neurological: Negative.   Hematological: Negative.   Psychiatric/Behavioral: Negative.   All other systems reviewed and are negative.    BP 150/50  Pulse 62  Ht 5\' 7"  (1.702 m)  Wt 105  lb (47.628 kg)  BMI 16.45 kg/m2  Physical Exam  Nursing note and vitals reviewed. Constitutional: She is oriented to person, place, and time. She appears well-developed and well-nourished.  HENT:  Head: Normocephalic.  Nose: Nose normal.  Mouth/Throat: Oropharynx is clear and moist.  Eyes: Conjunctivae normal are normal. Pupils are equal, round, and reactive to light.  Neck: Normal range of motion. Neck supple. No JVD present.  Cardiovascular: Normal rate, regular rhythm, S1 normal, S2 normal, normal heart sounds and intact distal pulses.  Exam reveals no gallop and no friction rub.   No murmur heard.      Trace  pitting edema around the ankles  Pulmonary/Chest: Effort normal. No respiratory distress. She has no wheezes. She has no rales. She exhibits no tenderness.  Abdominal: Soft. Bowel sounds are normal. She exhibits no distension. There is no tenderness.  Musculoskeletal: Normal range of motion. She exhibits no tenderness.  Lymphadenopathy:    She has no cervical adenopathy.  Neurological: She is alert and oriented to person, place, and time. Coordination normal.  Skin: Skin is warm and dry. No rash noted. No erythema.  Psychiatric: She has a normal mood and affect. Her behavior is normal. Judgment and thought content normal.         Assessment and Plan

## 2012-03-23 NOTE — Assessment & Plan Note (Addendum)
Weight has decreased from 137 pounds to 122 pounds now down to 105 pounds in the last 3 clinic visits. We have suggested she take Lasix 20 mg every other day. Goal weight in the office is 105-110 pounds, always at home would be 100-105 pounds. We will check her basic metabolic panel today. Overall she looks very good. She will take extra Lasix for weight increases more than 3 pounds.

## 2012-03-23 NOTE — Assessment & Plan Note (Signed)
She has followup with Dr. Ronn Melena

## 2012-03-24 LAB — BASIC METABOLIC PANEL
BUN/Creatinine Ratio: 25 (ref 11–26)
BUN: 69 mg/dL — ABNORMAL HIGH (ref 10–36)
Creatinine, Ser: 2.8 mg/dL — ABNORMAL HIGH (ref 0.57–1.00)
GFR calc Af Amer: 17 mL/min/{1.73_m2} — ABNORMAL LOW (ref >59)
GFR calc non Af Amer: 14 mL/min/{1.73_m2} — ABNORMAL LOW (ref >59)

## 2012-03-25 ENCOUNTER — Ambulatory Visit: Payer: Medicare Other | Admitting: Cardiovascular Disease

## 2012-04-14 IMAGING — CT CT HEAD WITHOUT CONTRAST
1 series · 16 of 30 positions shown, 20 images · non-contrast
Comparison: none

REASON FOR EXAM: CVA
COMMENTS:   May transport without cardiac monitor

PROCEDURE:     CT  - CT HEAD WITHOUT CONTRAST  - July 15, 2011 [DATE]
RESULT:     Comparison:  None
TECHNIQUE: Multiple axial images from the foramen magnum to the vertex were
obtained without IV contrast.

[Series 2: soft tissue · axial · 0.40mm/px · z∈[-176,-36]mm · 16 of 32 slices shown, 20 images]
[im 2/32  brain]
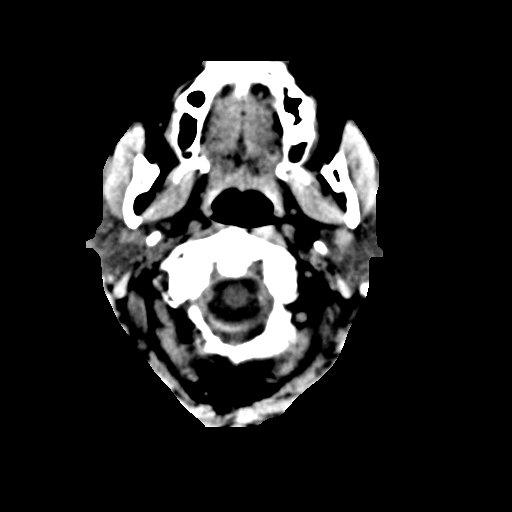
[im 2/32  bone]
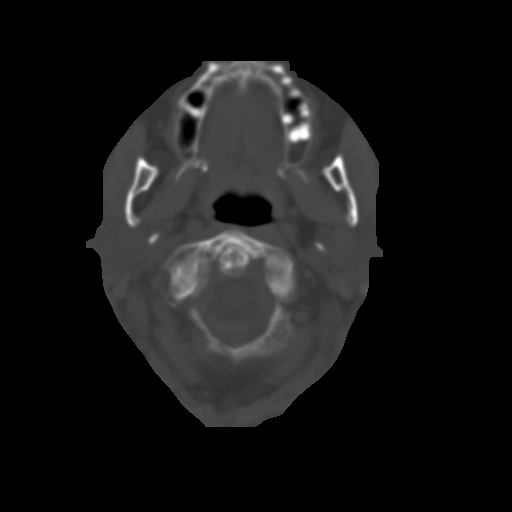
[im 4/32  brain]
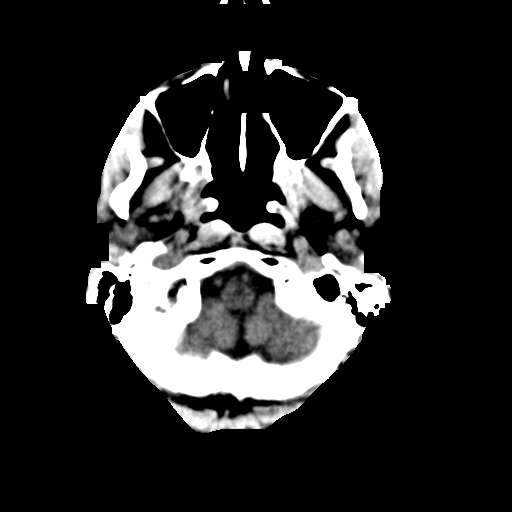
[im 6/32  brain]
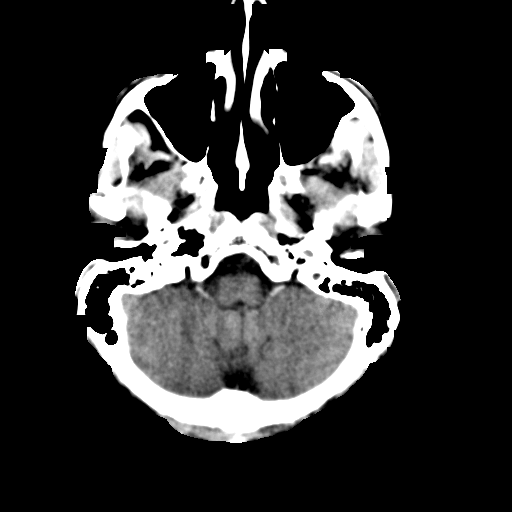
[im 8/32  brain]
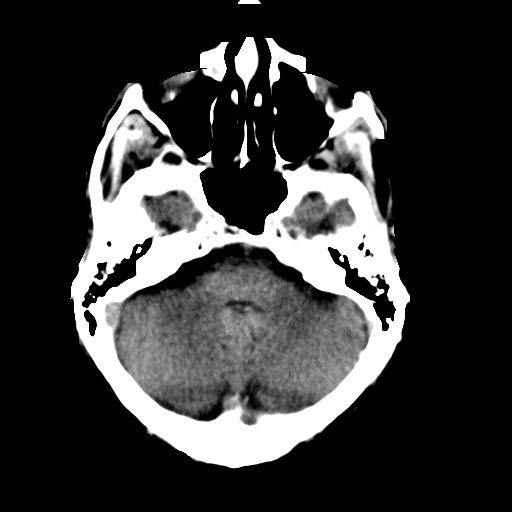
[im 9/32  brain]
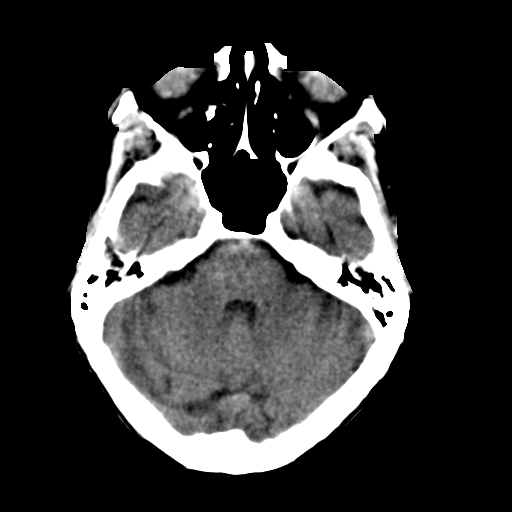
[im 9/32  bone]
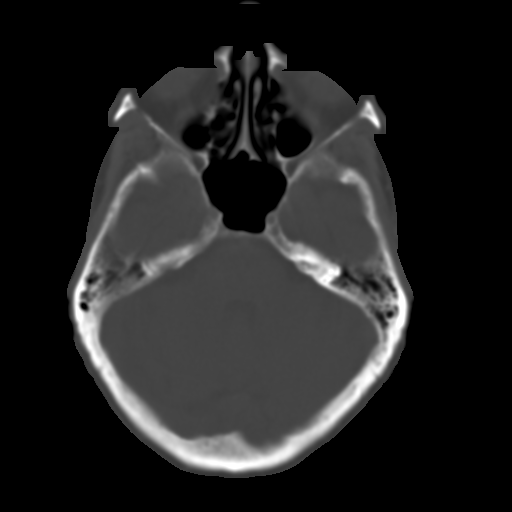
[im 11/32  brain]
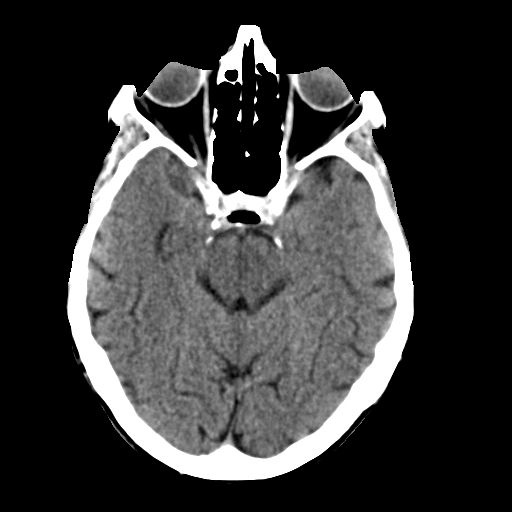
[im 13/32  brain]
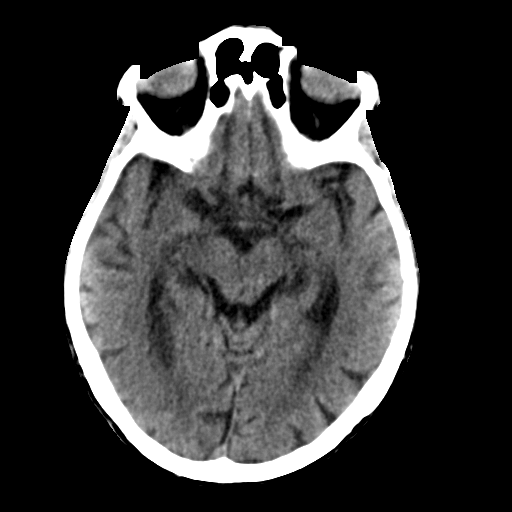
[im 15/32  brain]
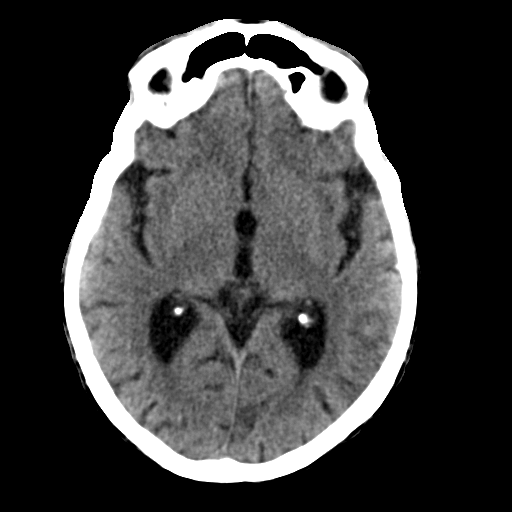
[im 17/32  brain]
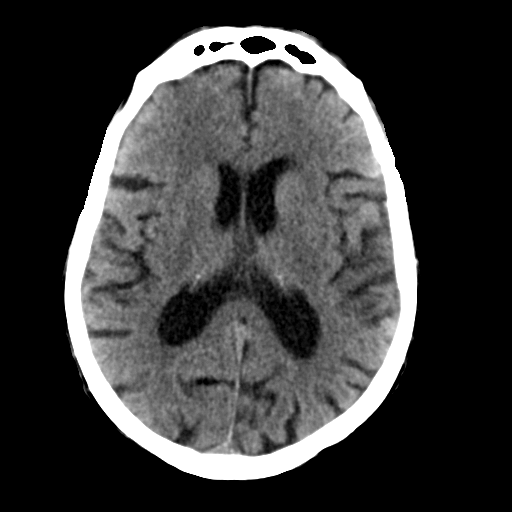
[im 17/32  bone]
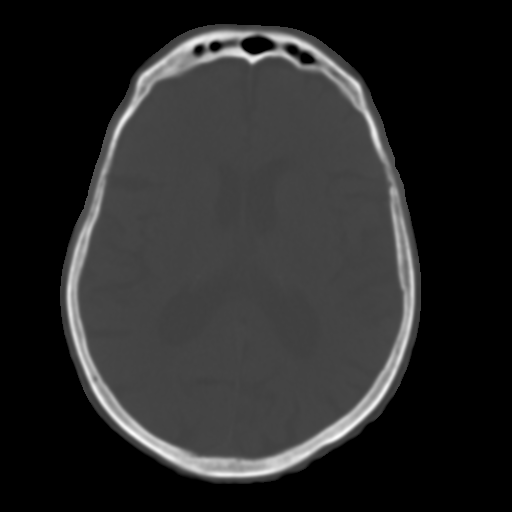
[im 19/32  brain]
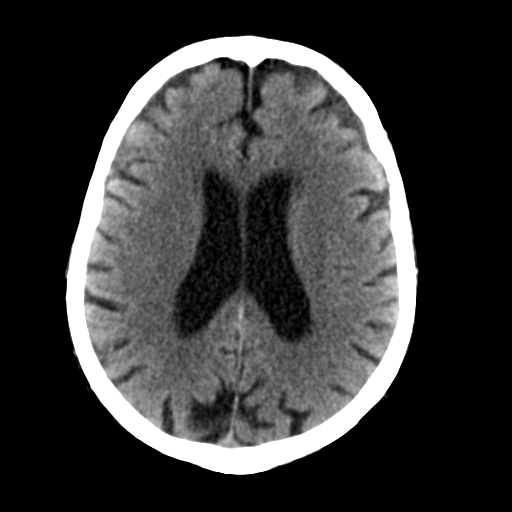
[im 21/32  brain]
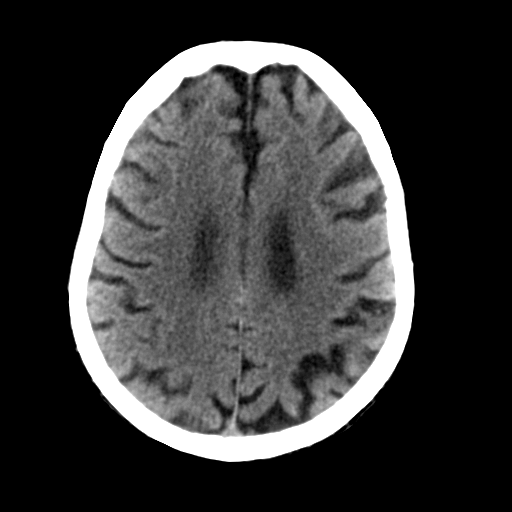
[im 23/32  brain]
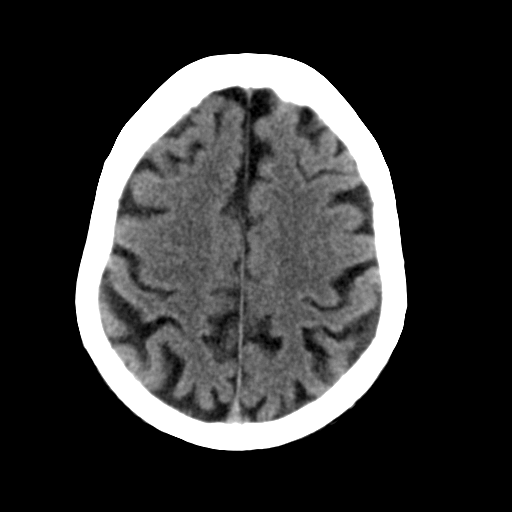
[im 24/32  brain]
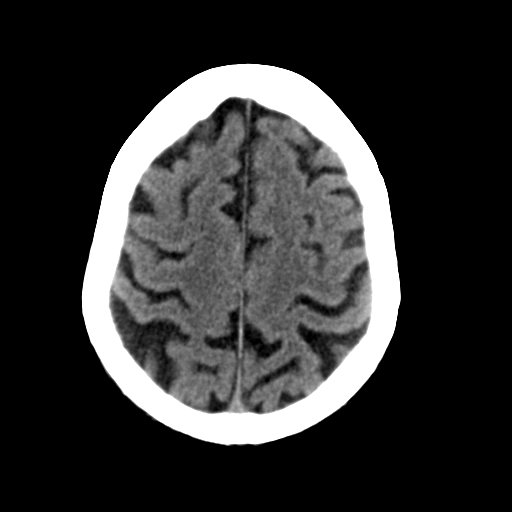
[im 24/32  bone]
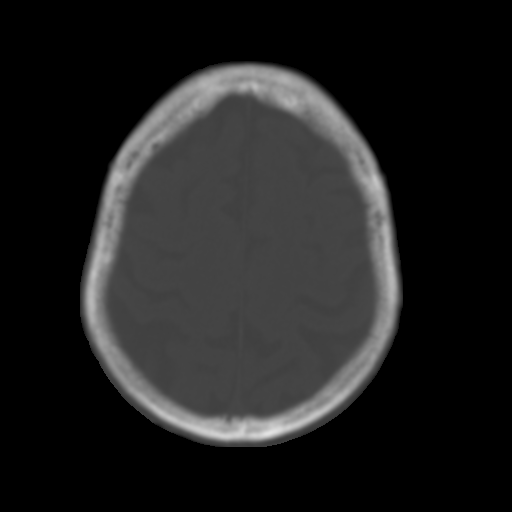
[im 26/32  brain]
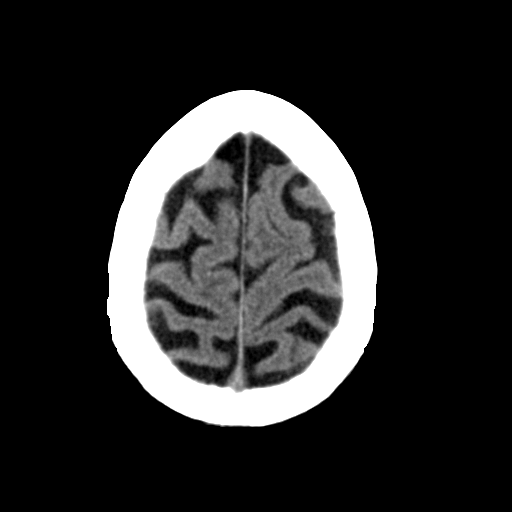
[im 28/32  brain]
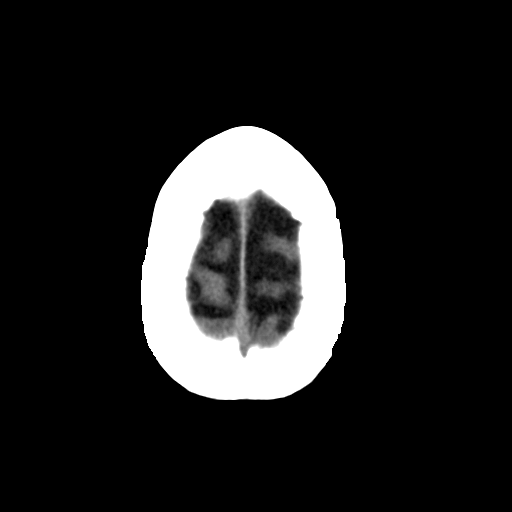
[im 30/32  brain]
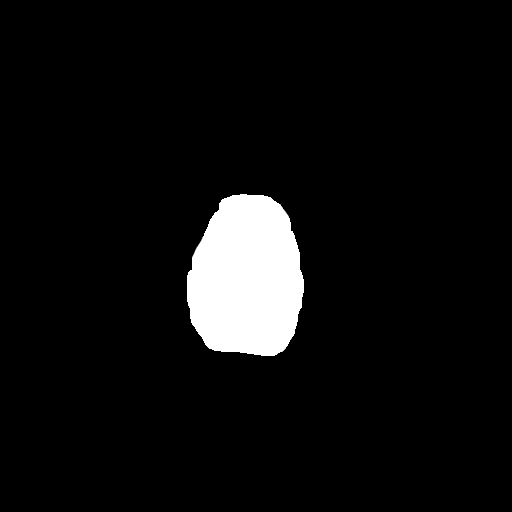

[16 of 30 positions shown; findings below may reference images not displayed]

FINDINGS: There is no evidence of mass effect, midline shift, or extra-axial fluid
collections.  There is no evidence of a space-occupying lesion or
intracranial hemorrhage. There is no evidence of a cortical-based area of
acute infarction. There is generalized cerebral atrophy. There is
periventricular white matter low attenuation likely secondary to
microangiopathy.

The ventricles and sulci are appropriate for the patient's age. The basal
cisterns are patent.

Visualized portions of the orbits are unremarkable. The visualized portions
of the paranasal sinuses and mastoid air cells are unremarkable.
Cerebrovascular atherosclerotic calcifications are noted.

The osseous structures are unremarkable.
IMPRESSION: No acute intracranial process.

## 2012-05-21 ENCOUNTER — Other Ambulatory Visit: Payer: Self-pay

## 2012-09-17 IMAGING — CR DG CHEST 2V
1 series · 2 of 2 positions shown · non-contrast
Comparison: none

REASON FOR EXAM: SOB
COMMENTS:

PROCEDURE:     DXR - DXR CHEST PA (OR AP) AND LATERAL  - December 18, 2011 [DATE]
RESULT:     Comparison: 07/15/2011

[Series 1: pa · 0.17mm/px · 2 of 2 slices shown]
[im 1/2]
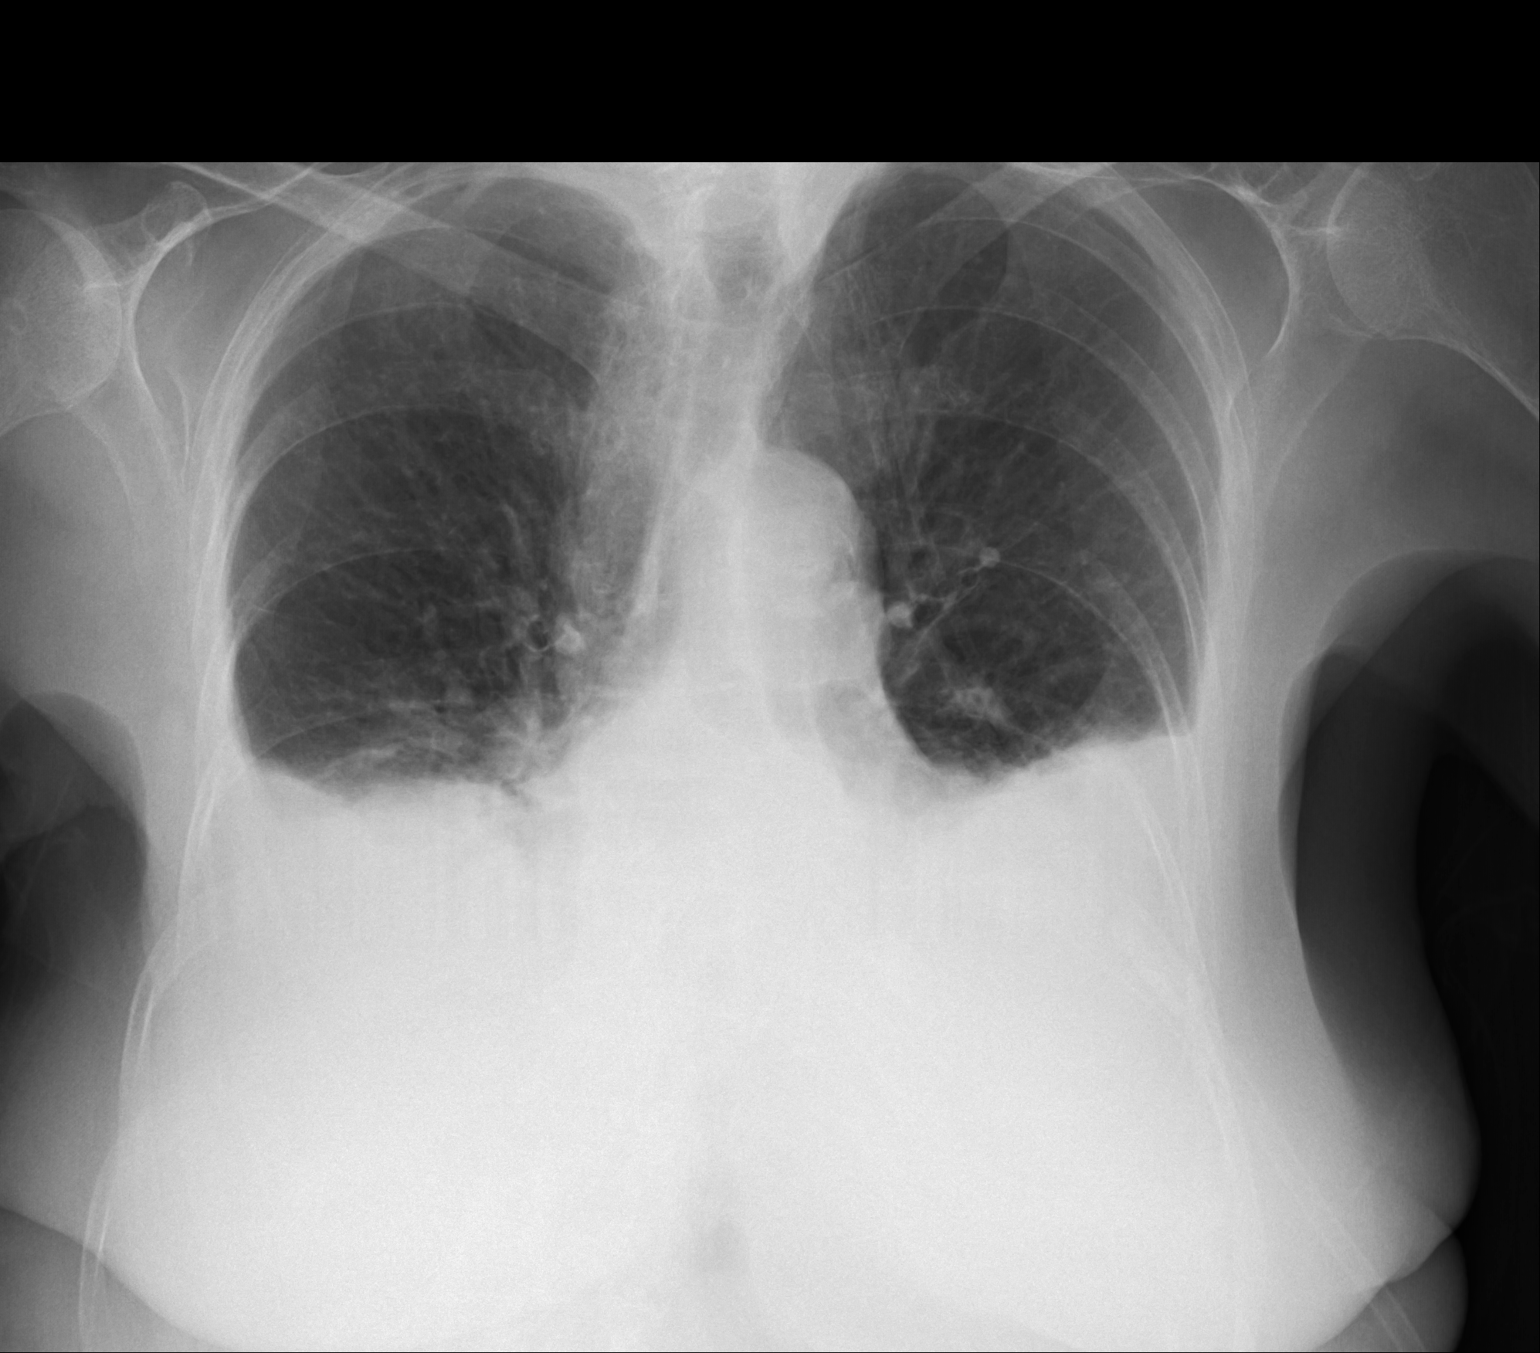
[im 2/2]
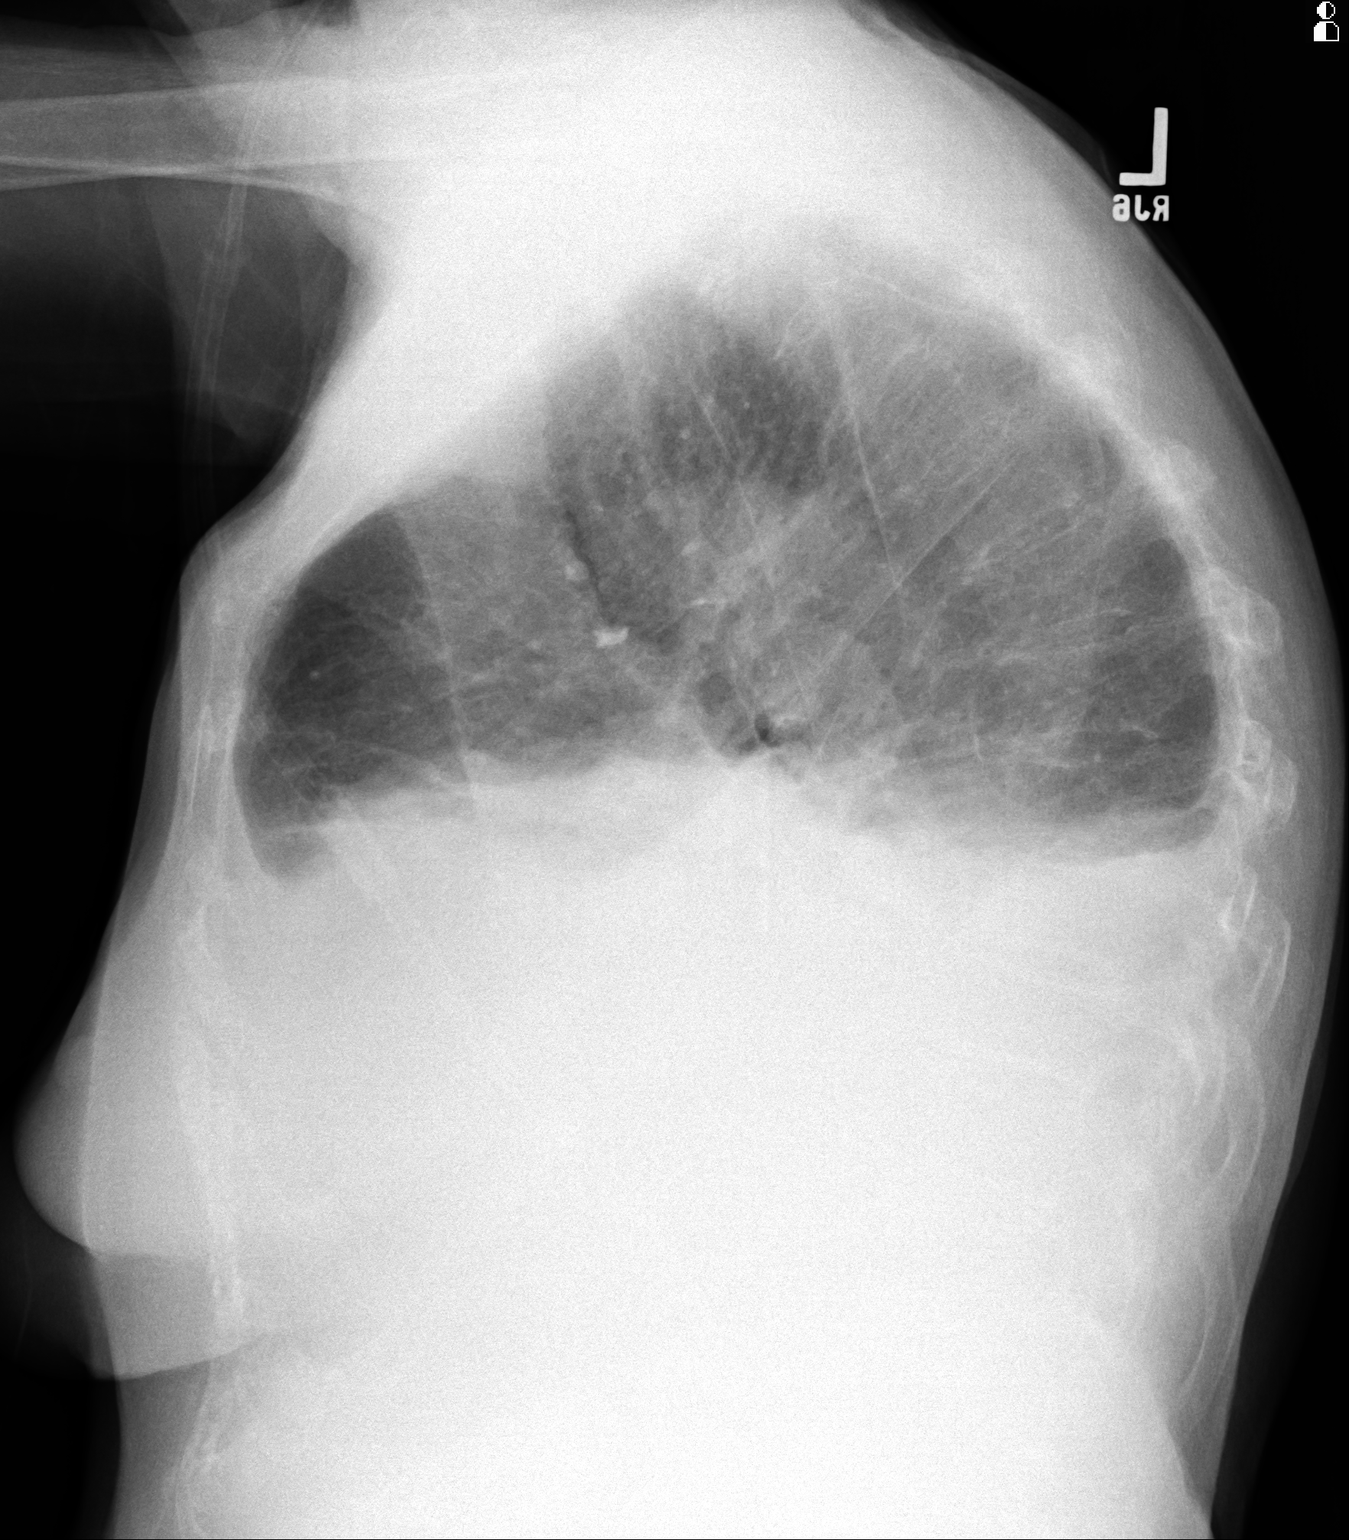

[2 of 2 positions shown; findings below may reference images not displayed]

FINDINGS: PA and lateral chest radiographs are provided. There are bilateral small
pleural effusions. There is mild bilateral interstitial thickening. There is
no focal parenchymal opacity, pleural effusion, or pneumothorax. The heart
and mediastinum are unremarkable.  The osseous structures are unremarkable.
IMPRESSION: Findings concerning for mild pulmonary edema.

[REDACTED]

## 2012-11-02 ENCOUNTER — Other Ambulatory Visit: Payer: Self-pay | Admitting: *Deleted

## 2012-11-02 MED ORDER — HYDRALAZINE HCL 100 MG PO TABS: 100.0000 mg | ORAL_TABLET | Freq: Three times a day (TID) | ORAL | Status: DC

## 2012-11-02 MED ORDER — FUROSEMIDE 40 MG PO TABS: 40.0000 mg | ORAL_TABLET | Freq: Every day | ORAL | Status: DC

## 2012-11-02 NOTE — Telephone Encounter (Signed)
Refilled Furosemide and hydralazine to [facility].

## 2012-11-08 ENCOUNTER — Ambulatory Visit (INDEPENDENT_AMBULATORY_CARE_PROVIDER_SITE_OTHER): Payer: Medicare Other | Admitting: Physician Assistant

## 2012-11-08 ENCOUNTER — Encounter: Payer: Self-pay | Admitting: Physician Assistant

## 2012-11-08 VITALS — BP 140/52 | HR 65 | Ht 67.0 in | Wt 105.2 lb

## 2012-11-08 DIAGNOSIS — I10 Essential (primary) hypertension: Secondary | ICD-10-CM

## 2012-11-08 DIAGNOSIS — R011 Cardiac murmur, unspecified: Secondary | ICD-10-CM

## 2012-11-08 DIAGNOSIS — I5032 Chronic diastolic (congestive) heart failure: Secondary | ICD-10-CM

## 2012-11-08 DIAGNOSIS — I4892 Unspecified atrial flutter: Secondary | ICD-10-CM

## 2012-11-08 DIAGNOSIS — I509 Heart failure, unspecified: Secondary | ICD-10-CM

## 2012-11-08 MED ORDER — FUROSEMIDE 40 MG PO TABS: 40.0000 mg | ORAL_TABLET | Freq: Every day | ORAL | Status: DC

## 2012-11-08 MED ORDER — CARVEDILOL 3.125 MG PO TABS: 3.1250 mg | ORAL_TABLET | Freq: Two times a day (BID) | ORAL | Status: DC

## 2012-11-08 MED ORDER — HYDRALAZINE HCL 100 MG PO TABS: 100.0000 mg | ORAL_TABLET | Freq: Three times a day (TID) | ORAL | Status: DC

## 2012-11-08 MED ORDER — SODIUM BICARBONATE 650 MG PO TABS: 650.0000 mg | ORAL_TABLET | Freq: Three times a day (TID) | ORAL | Status: DC

## 2012-11-08 NOTE — Patient Instructions (Addendum)
We will see you back in 6 months.   No changes will be made today. Continue current medications and heart failure management. Continue to elevate your legs for swelling and keep weight between 105-110 lbs. You may take another tablet of Lasix as needed for worsening swelling, shortness of breath or weight increase.

## 2012-11-08 NOTE — Assessment & Plan Note (Signed)
NSR today. Continue carvedilol. Would not start on anticoagulation given advanced age, CKD and dementia.

## 2012-11-08 NOTE — Assessment & Plan Note (Addendum)
BP 140/52. Goal should be <140/90 with CKD. Advised to keep on higher end, however with lightheadedness and down-titration of BP meds in the past. Avoid aggressive BP control. Continue current antihypertensives.

## 2012-11-08 NOTE — Assessment & Plan Note (Signed)
Soft II/VI systolic crescendo-decrescendo murmur at RUSB radiating to carotids c/w mild AV sclerosis on 07/2011 echo. EF preserved. No change in functional capacity or fluid status. Will continue to monitor. If symptoms change, consider follow-up echo to re-evaluate.

## 2012-11-08 NOTE — Progress Notes (Signed)
Patient ID: Erin Horton, female    DOB: 1919/04/14, 79 y.o.   MRN: [id number]  HPI Comments: Erin Horton is a pleasant 79 year old woman with atrial flutter, chronic diastolic CHF, CKD (baseline stage IV), dementia, history of TIA, PVD s/p RLE bypass grafting who presents today for follow-up.   Last hospital admission 12/18/2011 to [name] with acute on chronic diastolic CHF in the setting of worsening renal failure, atrial flutter. Previously, she had chronic severe lower extremity edema for several months and worsening shortness of breath. In the hospital, she was found to have mild to moderate bilateral pleural effusions she had significant diuresis in the hospital, approximately 6 L or more. Renal function was essentially stable through this.   Echocardiogram in the hospital showed ejection fraction 55% , diastolic dysfunction  On a previous office visit amlodipine and diltiazem were held on the assumption that it was causing side effects of worsening lower extremity edema. She was started on Lasix 40 mg daily. In followup 3 weeks later, her edema had significantly improved, she has had dramatic weight loss and feels better. Her weight had decreased from 137->122-105 lbs in 03/2012. Lasix reduced to 20mg  ever other day and as needed for edema. 105 lbs today.  She feels well and is in fantastic spirits today. No edema or shortness of breath. She has excellent family support who manage her diet and medications. She independently completes all ADLs. Memory is good for the most part. Memory loss waxes and wanes. Weight stays between 105-110 lbs. She continues to take Lasix daily.   Last EKG 03/2012- normal sinus rhythm with rate 62 beats per minute with no significant ST or T wave changes EKG today- NSR, 65 bpm, IVCD I, aVL, no ST/T changes     Outpatient Encounter Prescriptions as of 03/23/2012  Medication Sig Dispense Refill  . ALPRAZolam (XANAX) 0.25 MG tablet Take 0.25 mg by mouth at bedtime.       Marland Kitchen aspirin 81 MG tablet Take 81 mg by mouth daily.      . carvedilol (COREG) 3.125 MG tablet Take 1 tablet (3.125 mg total) by mouth 2 (two) times daily with a meal.  180 tablet  3  . furosemide (LASIX) 40 MG tablet Take 40 mg by mouth daily.      . hydrALAZINE (APRESOLINE) 100 MG tablet Take 100 mg by mouth 3 (three) times daily.      Marland Kitchen levothyroxine (SYNTHROID, LEVOTHROID) 75 MCG tablet Take 75 mcg by mouth daily.       . Melatonin 5 MG TABS Take by mouth at bedtime.      . NON FORMULARY Oxygen 3-4 liters at bedtime.      . sodium bicarbonate 650 MG tablet Take 650 mg by mouth 3 (three) times daily.        Review of Systems  Constitutional: Negative.   HENT: Negative.   Eyes: Negative.   Gastrointestinal: Negative.   Musculoskeletal: Positive for gait problem.  Skin: Negative.   Neurological: Negative.   Psychiatric/Behavioral: Negative.   All other systems reviewed and are negative.    There were no vitals taken for this visit.  Physical Exam  Nursing note and vitals reviewed. Constitutional: She is oriented to person, place, and time. She appears well-developed and well-nourished.  Appears younger than stated age  HENT:  Head: Normocephalic.  Nose: Nose normal.  Mouth/Throat: Oropharynx is clear and moist.  Eyes: Conjunctivae are normal. Pupils are equal, round, and reactive to light.  Neck:  Normal range of motion. Neck supple. No JVD present.  Cardiovascular: Normal rate, regular rhythm, S1 normal, S2 normal, normal heart sounds and intact distal pulses.  Exam reveals no gallop and no friction rub.   No murmur heard. II/VI systolic murmur radiating to carotids  Pulmonary/Chest: Effort normal. No respiratory distress. She has no wheezes. She has no rales. She exhibits no tenderness.  Abdominal: Soft. Bowel sounds are normal. She exhibits no distension. There is no tenderness.  Musculoskeletal: Normal range of motion. She exhibits no tenderness.  Trace bilateral  nonpitting pedal edema  Lymphadenopathy:    She has no cervical adenopathy.  Neurological: She is alert and oriented to person, place, and time. Coordination normal.  Skin: Skin is warm and dry. No rash noted. No erythema.  Psychiatric: She has a normal mood and affect. Her behavior is normal. Judgment and thought content normal.    Assessment and Plan

## 2012-11-08 NOTE — Assessment & Plan Note (Addendum)
She reports no worsening DOE/SOB, edema, PND or orthopnea. Family helps restrict salt in her diet and weight management. She has maintained a previously established weight of 105-110 lbs since last follow-up. She continues to take Lasix every other day and PRN for swelling or weight increase. She is enjoying an active and well-rounded quality of life. Will continue current regimen and see her back in 6 months.

## 2012-11-09 ENCOUNTER — Other Ambulatory Visit: Payer: Self-pay

## 2013-02-09 ENCOUNTER — Other Ambulatory Visit: Payer: Self-pay

## 2013-02-27 ENCOUNTER — Other Ambulatory Visit: Payer: Self-pay | Admitting: *Deleted

## 2013-02-28 MED ORDER — HYDRALAZINE HCL 100 MG PO TABS: 100.0000 mg | ORAL_TABLET | Freq: Three times a day (TID) | ORAL | Status: DC

## 2013-02-28 MED ORDER — FUROSEMIDE 40 MG PO TABS: 40.0000 mg | ORAL_TABLET | Freq: Every day | ORAL | Status: DC

## 2013-02-28 MED ORDER — SODIUM BICARBONATE 650 MG PO TABS: 650.0000 mg | ORAL_TABLET | Freq: Three times a day (TID) | ORAL | Status: AC

## 2013-04-17 ENCOUNTER — Other Ambulatory Visit: Payer: Self-pay | Admitting: *Deleted

## 2013-04-17 MED ORDER — HYDRALAZINE HCL 100 MG PO TABS: 100.0000 mg | ORAL_TABLET | Freq: Three times a day (TID) | ORAL | Status: DC

## 2013-04-17 NOTE — Telephone Encounter (Signed)
Requested Prescriptions   Signed Prescriptions Disp Refills  . hydrALAZINE (APRESOLINE) 100 MG tablet 90 tablet 3    Sig: Take 1 tablet (100 mg total) by mouth 3 (three) times daily.    Authorizing Provider: Gery PrayARGUELLO, ROGER A    Ordering User: Kendrick FriesLOPEZ, MARINA C

## 2013-07-04 ENCOUNTER — Ambulatory Visit: Payer: Self-pay | Admitting: Family Medicine

## 2013-07-14 ENCOUNTER — Ambulatory Visit: Payer: Self-pay | Admitting: Family Medicine

## 2014-02-23 ENCOUNTER — Other Ambulatory Visit: Payer: Self-pay | Admitting: Cardiovascular Disease

## 2014-02-23 ENCOUNTER — Other Ambulatory Visit: Payer: Self-pay | Admitting: Physician Assistant

## 2014-03-06 ENCOUNTER — Telehealth: Payer: Self-pay | Admitting: Cardiovascular Disease

## 2014-03-06 NOTE — Telephone Encounter (Signed)
Pt daughter in law called to see if we could send in some refills on pt meds. She is not sure which ones she needs please call her, she will try and find out. If we can do refill please send to [facility]. She would just like enough to last until the apt.

## 2014-03-07 NOTE — Telephone Encounter (Signed)
NA x 1

## 2014-03-13 ENCOUNTER — Telehealth: Payer: Self-pay | Admitting: Cardiovascular Disease

## 2014-03-13 MED ORDER — FUROSEMIDE 40 MG PO TABS: 40.0000 mg | ORAL_TABLET | Freq: Every day | ORAL | Status: DC

## 2014-03-13 MED ORDER — CARVEDILOL 3.125 MG PO TABS: 3.1250 mg | ORAL_TABLET | Freq: Two times a day (BID) | ORAL | Status: DC

## 2014-03-13 MED ORDER — HYDRALAZINE HCL 100 MG PO TABS: 100.0000 mg | ORAL_TABLET | Freq: Three times a day (TID) | ORAL | Status: DC

## 2014-03-13 NOTE — Telephone Encounter (Signed)
Yes, if they do need refill. Please do refill. Per [facility]

## 2014-03-13 NOTE — Telephone Encounter (Signed)
LMOM to call regarding refills.

## 2014-03-13 NOTE — Telephone Encounter (Signed)
Notified patient's daughter in law we can send in but the patient does need to come for the appointment with Dr. Mariah MillingGollan on 03/15/2014.

## 2014-03-13 NOTE — Telephone Encounter (Signed)
Patient is coming to see Dr. Mariah MillingGollan on 03/15/2014.

## 2014-03-15 ENCOUNTER — Ambulatory Visit (INDEPENDENT_AMBULATORY_CARE_PROVIDER_SITE_OTHER): Payer: Medicare Other | Admitting: Cardiovascular Disease

## 2014-03-15 ENCOUNTER — Encounter: Payer: Self-pay | Admitting: Cardiovascular Disease

## 2014-03-15 VITALS — BP 138/58 | HR 63 | Ht 60.0 in | Wt 97.5 lb

## 2014-03-15 DIAGNOSIS — I10 Essential (primary) hypertension: Secondary | ICD-10-CM

## 2014-03-15 DIAGNOSIS — N179 Acute kidney failure, unspecified: Secondary | ICD-10-CM

## 2014-03-15 DIAGNOSIS — I5032 Chronic diastolic (congestive) heart failure: Secondary | ICD-10-CM

## 2014-03-15 DIAGNOSIS — I4892 Unspecified atrial flutter: Secondary | ICD-10-CM

## 2014-03-15 DIAGNOSIS — N186 End stage renal disease: Secondary | ICD-10-CM

## 2014-03-15 DIAGNOSIS — E538 Deficiency of other specified B group vitamins: Secondary | ICD-10-CM

## 2014-03-15 DIAGNOSIS — R609 Edema, unspecified: Secondary | ICD-10-CM

## 2014-03-15 MED ORDER — FUROSEMIDE 40 MG PO TABS: 40.0000 mg | ORAL_TABLET | Freq: Every day | ORAL | Status: AC | PRN

## 2014-03-15 NOTE — Assessment & Plan Note (Addendum)
Family inquired about receiving B-12 shots monthly. Suggested she follow-up with primary care. If needed, perhaps home nursing to be arranged

## 2014-03-15 NOTE — Assessment & Plan Note (Signed)
She is not drinking very much per the family. We will hold the Lasix given her elevated BUN and creatinine. They will closely watch her weight and restart Lasix for weight greater than 105 pounds

## 2014-03-15 NOTE — Assessment & Plan Note (Signed)
Lyme status unclear though given recent lab work showing creatinine of 3, elevated BUN, we have recommended that she hold her Lasix. Family reports that she does not drink frame much in the daytime. Recommended repeat basic metabolic panel in several months time.

## 2014-03-15 NOTE — Assessment & Plan Note (Signed)
Blood pressure is well controlled on today's visit. No changes made to the medications. 

## 2014-03-15 NOTE — Assessment & Plan Note (Signed)
No significant leg edema on today's visit. Symptoms improved with diuretics in the past, holding amlodipine

## 2014-03-15 NOTE — Assessment & Plan Note (Signed)
Maintaining normal sinus rhythm 

## 2014-03-15 NOTE — Patient Instructions (Addendum)
You are doing well. Please hold the lasix/furosemide, Take only for leg swelling, weight over 105 pounds (ok to take a 1/2 pill)  Recheck labs in 3 months  Please call us if you have new issues that need to be addressed before your next appt.  Your physician wants you to follow-up in: 6 months.  You will receive a reminder letter in the mail two months in advance. If you don't receive a letter, please call our office to schedule the follow-up appointment.

## 2014-03-15 NOTE — Progress Notes (Signed)
Patient ID: Erin Horton, female    DOB: February 20, 1920, 80 y.o.   MRN: [id number]  HPI Comments: Ms. Erin Horton is a pleasant 80 year old woman with chronic kidney disease, stage IV, dementia, history of TIA, peripheral vascular disease with right lower extremity bypass grafting with  hospital admission 12/18/2011 to [facility] with acute on chronic diastolic CHF in the setting of worsening renal failure, atrial flutter.  She presents today for follow-up of her diastolic CHF  She presents with family. They report that she is not eating or drinking much. Weight is down to 97 pounds, it was 105 pounds last year Otherwise she feels well, denies any leg edema, no shortness of breath with exertion She continues to live at home. Family visits at least once per week. Son is her POA She continues to take Lasix 40 mg daily. On a prior clinic visit, we had recommended she decrease her Lasix to 20 mg every other day, but now taking higher dose daily  per the family Family does report she has occasional lightheadedness when she stands up. This typically resolves relatively quickly  EKG on today's visit shows normal sinus rhythm with rate 63 bpm, no significant ST or T-wave changes. Additional past medical history Previously, she had chronic severe lower extreme edema for several months Also with worsening shortness of breath. In the hospital, she was found to have mild to moderate bilateral pleural effusions she had significant diuresis in the hospital, approximately 6 L or more. Renal function was essentially stable through this.  Baseline creatinine 2.0, BNP in the hospital was 1400 Echocardiogram in the hospital showed ejection fraction 55% , diastolic dysfunction Chest x-ray in the hospital showed mild-to-moderate pulmonary effusions bilaterally Amlodipine held previously with improvement of her leg edema  Previous EKG in the hospital 12/18/2011 appears to show atrial flutter with ventricular rate 73 beats per  minute EKG on last office visit showed normal sinus rhythm with rate 62 beats per minute with no significant ST or T wave changes EKG today shows normal sinus rhythm with rate 62 beats per minute with no significant ST or T wave changes     No Known Allergies  Outpatient Encounter Prescriptions as of 03/15/2014  Medication Sig  . ALPRAZolam (XANAX) 0.25 MG tablet Take 0.25 mg by mouth at bedtime as needed.   Marland Kitchen. aspirin 81 MG tablet Take 81 mg by mouth daily.  . carvedilol (COREG) 3.125 MG tablet Take 1 tablet (3.125 mg total) by mouth 2 (two) times daily with a meal.  . furosemide (LASIX) 40 MG tablet Take 1 tablet (40 mg total) by mouth daily as needed.  . hydrALAZINE (APRESOLINE) 100 MG tablet Take 1 tablet (100 mg total) by mouth 3 (three) times daily.  Marland Kitchen. levothyroxine (SYNTHROID, LEVOTHROID) 75 MCG tablet Take 75 mcg by mouth daily.   . Melatonin 5 MG TABS Take by mouth at bedtime.  . sodium bicarbonate 650 MG tablet Take 1 tablet (650 mg total) by mouth 3 (three) times daily.  . [DISCONTINUED] furosemide (LASIX) 40 MG tablet Take 1 tablet (40 mg total) by mouth daily.    Past Medical History  Diagnosis Date  . Hypertension   . CHF (congestive heart failure)   . Transient ischemic attack   . Chronic kidney disease   . Hypothyroidism   . Hyperlipidemia   . Alzheimer's dementia   . Osteoarthritis   . Glaucoma   . Peripheral vascular disease     with right lower extremity bypass graft  Past Surgical History  Procedure Laterality Date  . Bypass graft      right lower extremity  . Abdominal hysterectomy    . Appendectomy      Social History  reports that she has never smoked. She does not have any smokeless tobacco history on file. She reports that she does not drink alcohol or use illicit drugs.  Family History Family history is unknown by patient.      Review of Systems  Constitutional: Positive for fatigue.  HENT: Negative.   Eyes: Negative.   Respiratory:  Negative.   Cardiovascular: Negative.   Gastrointestinal: Negative.   Endocrine: Negative.   Musculoskeletal: Positive for gait problem.  Skin: Negative.   Allergic/Immunologic: Negative.   Neurological: Negative.   Hematological: Negative.   Psychiatric/Behavioral: Negative.   All other systems reviewed and are negative.   BP 138/58 mmHg  Pulse 63  Ht 5' (1.524 m)  Wt 97 lb 8 oz (44.226 kg)  BMI 19.04 kg/m2  Physical Exam  Constitutional: She is oriented to person, place, and time. She appears well-developed and well-nourished.  HENT:  Head: Normocephalic.  Nose: Nose normal.  Mouth/Throat: Oropharynx is clear and moist.  Eyes: Conjunctivae are normal. Pupils are equal, round, and reactive to light.  Neck: Normal range of motion. Neck supple. No JVD present.  Cardiovascular: Normal rate, regular rhythm, S1 normal, S2 normal, normal heart sounds and intact distal pulses.  Exam reveals no gallop and no friction rub.   No murmur heard. Pulmonary/Chest: Effort normal and breath sounds normal. No respiratory distress. She has no wheezes. She has no rales. She exhibits no tenderness.  Abdominal: Soft. Bowel sounds are normal. She exhibits no distension. There is no tenderness.  Musculoskeletal: Normal range of motion. She exhibits no edema or tenderness.  Lymphadenopathy:    She has no cervical adenopathy.  Neurological: She is alert and oriented to person, place, and time. Coordination normal.  Skin: Skin is warm and dry. No rash noted. No erythema.  Psychiatric: She has a normal mood and affect. Her behavior is normal. Judgment and thought content normal.    Assessment and Plan  Nursing note and vitals reviewed.

## 2014-06-08 ENCOUNTER — Other Ambulatory Visit: Payer: Self-pay | Admitting: Cardiovascular Disease

## 2014-07-24 NOTE — Discharge Summary (Signed)
PATIENT NAME:  Erin Horton, Erin Horton MR#:  [id number] DATE OF BIRTH:  1919-12-21  DATE OF ADMISSION:  12/18/2011 DATE OF DISCHARGE:  12/23/2011  CHIEF COMPLAINT: Swelling in extremities, shortness of breath, and orthopnea.   DISCHARGE DIAGNOSES:  1. Acute on chronic diastolic congestive heart failure.  2. Chronic kidney disease, stage IV.  3. Pleural effusions.  4. Hyperkalemia.  5. Hypertension.  6. Dementia.  7. Hypothyroidism.  8. History of transient ischemic attack.  9. Hyperlipidemia.  10. Peripheral vascular disease with right lower extremity bypass grafting.   DISCHARGE MEDICATIONS:  1. Lasix 40 mg daily.  2. Synthroid 75 mcg one tab daily alternate with 50 mcg every other day. 3. Cardia XL 180 mg extended-release one tab daily. 4. Amlodipine 5 mg daily.  5. Hydralazine 10 mg four times a day.  6. Sodium bicarbonate 650 mg one tab three times daily. 7. Melatonin 5 mg once a day at bedtime. 8. Aspirin 81 mg daily.  9. Exelon patch one patch transdermal once a day. 10. Carvedilol 3.125 mg two times a day.   DIET: Low sodium, renal diet.   ACTIVITY: As tolerated.   DISCHARGE FOLLOWUP: Please follow with your primary care physician and nephrologist within 1 to 2 weeks. Please obtain a cardiologist and follow up within 1 to 2 weeks.   DISPOSITION: Home with home health.  CODE STATUS: DO NOT RESUSCITATE.   HISTORY OF PRESENT ILLNESS: For full details of the history and physical, please see the dictation on 12/18/2011 by Dr. Milagros Loll, but briefly this is a 81 year old pleasant Caucasian female with history of diastolic congestive heart failure and chronic kidney disease stage IV who follows with Dr. Lamont Dowdy with baseline creatinine of about 2 who does not take Lasix on a regular basis and takes it on a p.r.n. basis 1/2 tab of a 40 mg tablet who presented with the above chief complaint. The patient was noted to have bilateral pleural effusions on a x-ray, elevated BNP,  and was short of breath and admitted to the hospitalist service for further evaluation and management.  SIGNIFICANT LABS AND IMAGING: Troponins were negative x3. Mildly elevated CK-MB, initially 5.2, then 4.7, then 3.8. Initial WBC 5.4, hemoglobin 12.1, and platelets 337. Initial creatinine 2.11. Last creatinine on 12/22/2011 was 2.17 and initial BUN was 37. The last BUN was 50. Initial potassium was 5.2 and peak potassium 5.5 and last potassium 4.4.   Initial x-ray, PA and lateral, is showing findings concerning for mild pulmonary edema.   Repeat x-ray, PA and lateral, is showing mild to moderate bilateral pleural effusions similar to prior, mild basilar opacities secondary to atelectasis likely.   HOSPITAL COURSE: The patient was admitted to the hospitalist service. She had acute on chronic diastolic congestive heart failure and was diuresed and has been net negative so far of about 6 liters and has no significant shortness of breath. On arrival she did have significant lower extremity edema of +4 and that has improved. She was also placed on 1.5 liters fluid restriction. Chloride was added to her outpatient regimen. Her creatinine has been stable with stable GFR, around stage IV. She does follow with a nephrologist and I discussed with her son on several occasions that the patient also needs to follow with a cardiologist and Lasix should be titrated between the nephrologist and the cardiologist to maintain euvolemic state without further kidney injury. Here she had been on the Lasix for several days without a bump in the creatinine significantly.  She did have hyperkalemia which improved with the Kayexalate and her      Alzheimer medications and hypothyroid medications were continued. She was seen by physical therapy and she will be going home with home health, PT, and RN. The patient is DNR.   TOTAL TIME SPENT: 35 minutes.   ____________________________ Krystal EatonShayiq Deshaun Weisinger,  MD sa:slb D: 12/23/2011 13:01:17 ET T: 12/23/2011 13:14:06 ET JOB#: [id number]  cc: Krystal EatonShayiq Austan Nicholl, MD, <Dictator> Laverda SorensonSarath C. Kolluru, MD Rhona LeavensJames F. Burnett ShengHedrick, MD Krystal EatonSHAYIQ Shaden Lacher MD ELECTRONICALLY SIGNED 12/25/2011 14:22

## 2014-07-24 NOTE — H&P (Signed)
PATIENT NAME:  Erin Horton, Erin Horton MR#:  [id number] DATE OF BIRTH:  Oct 15, 1919  DATE OF ADMISSION:  12/18/2011  PRIMARY CARE PHYSICIAN: Rhona LeavensJames F. Burnett ShengHedrick, MD   NOTE: History was obtained from patient, family at bedside. Old records have been reviewed. The case was discussed with the ER physician, Dr. Carollee MassedKaminski. Chest x-ray and EKG were personally reviewed.   CHIEF COMPLAINT: Upper and lower extremity swelling, shortness of breath and orthopnea.   HISTORY OF PRESENT ILLNESS: The patient is a 81 year old female patient with history of hypertension, diastolic congestive heart failure with ejection fraction 55%, transient ischemic attack, presents to the Emergency Room brought in by her family with complaints of worsening upper extremity swelling and shortness of breath. The patient does have history of chronic kidney, stage 4, and follows with Dr. Lamont DowdySarath Kolluru with baseline creatinine around 2, has chronic lower extremity swelling bilaterally; but the patient and her family have noticed that the patient is progressively getting more short of breath at rest and with exertion and tends to wake up at night feeling shortness of breath. Over the last two days, the patient has also had upper extremity swelling and has presented to the Emergency Room. Here her BNP is elevated at 1400, needing oxygen support at 2 liters oxygen, and chest x-ray shows bilateral small pleural effusions. She does not complain of any chest pain, cough, abdominal pain, nausea, vomiting, diarrhea. She does mention decreased urination over the past few days. No recent change in medications. The patient is on Lasix at home but uses only as needed 1/2 pill of the 40 mg pill she had. She lives alone and has a caretaker taking care of her.   PAST MEDICAL HISTORY:  1. Hypertension.  2. Diastolic congestive heart failure with ejection fraction 55%, with last echocardiogram in April 2013.  3. Transient ischemic attack.  4. Chronic kidney disease,  stage 4, with baseline creatinine around 2.0.  5. Hypothyroidism.  6. Hyperlipidemia.  7. Alzheimer's dementia, mild.  8. Hyperlipidemia.  9. Osteoarthritis.  10. Glaucoma.  11. Peripheral vascular disease with right lower extremity bypass grafting.   PAST SURGICAL HISTORY:  1. Bypass graft in the right lower extremity.  2. Hysterectomy.  3. Appendectomy.   ALLERGIES: Coconut and nuts,  no drug allergies.   SOCIAL HISTORY: The patient does not smoke, drink alcohol, or use illicit drugs. She lives alone and has a caretaker at home. She ambulates on her own at home but uses a cane or a walker when she goes out.   CODE STATUS: DO NOT RESUSCITATE/DO NOT INTUBATE as discussed with the patient and her son at bedside, who is her healthcare power of attorney.   FAMILY HISTORY: Positive for hypertension, diabetes.   HOME MEDICATIONS:  1. Lasix 40 mg oral tablet daily as needed.  2. Cartia XT 180 mg daily.  3. Norvasc 5 mg oral daily.  4. Synthroid 75 mcg oral once daily.  5. Aspirin 81 mg oral daily.  6. Exelon one patch transdermal once daily.  7. Hydralazine 10 mg oral q.i.d.  8. Melatonin 5 mg oral once a day.  9. Sodium bicarbonate 655 mg, 1 tablet t.i.d.  10. Tylenol PM 500/25 oral once a day as needed at bedtime.   REVIEW OF SYSTEMS: CONSTITUTIONAL: No fever, fatigue or weakness. EYES: No blurred vision, pain, redness. ENT: No tinnitus, ear pain, hearing loss. RESPIRATORY: Does complain of shortness of breath at rest and exertion, but no cough, wheeze, or hemoptysis. CARDIOVASCULAR: No chest  pain. She does have orthopnea and erythema in all four extremities. GI: No nausea, vomiting, diarrhea, abdominal pain. GENITOURINARY: No dysuria or hematuria. Decreased urination and dark. ENDOCRINE: No nocturia, polyuria. Has hypothyroidism. HEMATOLOGIC/LYMPHATIC: No easy bruising, bleeding. SKIN: No rash or suspicious lesions. MUSCULOSKELETAL: Has osteoporosis, osteoarthritis, some on and off  back pain. No other joint swelling or redness. NEUROLOGIC: No focal numbness, weakness, dysarthria.  Does have mild dementia. PSYCHIATRIC: No anxiety. Dementia.   PHYSICAL EXAMINATION:  VITAL SIGNS: Temperature 96, pulse of 75, blood pressure 165/77, saturating 90% on room air and 95% on 2 liters oxygen.   GENERAL: Elderly Caucasian female patient lying in bed, comfortable, conversational, cooperative with exam.   PSYCHIATRIC: Alert and oriented x3. Mood and affect appropriate. Judgment intact.   HEENT: Atraumatic, normocephalic. Oral mucosa moist and pink. External ears and nose normal. Pallor positive. No icterus. Pupils bilaterally equal and reactive to light.   NECK: Supple. No thyromegaly. No palpable lymph nodes. Trachea midline. No carotid bruit or JVD.   CARDIOVASCULAR: S1 and S2 with a diastolic murmur. Peripheral pulses 2+ and palpable. Has 3+ pitting edema up to the knees and edema in her upper extremities.   RESPIRATORY: Normal work of breathing. Decreased air entry at the bases along with crackles. Normal to percussion.   GASTROINTESTINAL: Soft abdomen, nontender. Bowel sounds present. No hepatosplenomegaly palpable.   SKIN: Warm and dry. No petechiae, rash, ulcers.   MUSCULOSKELETAL: No joint swelling, redness, or effusion of the large joints. Normal muscle tone.   NEUROLOGICAL: Motor strength 5/5 in upper and lower extremities. Sensation to fine touch intact all over. Cranial nerves II through XII intact.   LABORATORY, DIAGNOSTIC AND RADIOLOGICAL DATA: Glucose 111, BNP 1484, BUN 37, creatinine 2.11, sodium 139, potassium 5.2, chloride 109, bicarbonate 24.  AST, ALT, alkaline phosphatase normal. Troponin less than 0.02. WBC 5.4, hemoglobin 12.1, platelets 337. EKG shows normal sinus rhythm with no acute ST-T wave changes. Chest x-ray shows clear lungs, no cardiomegaly, but small bilateral pleural effusions on  personal review. Radiology report awaited. No signs of vascular  congestion.   ASSESSMENT/PLAN:  1. Acute on chronic diastolic congestive heart failure: The patient will be admitted for IV diuresis. The patient does have orthopnea and is needing oxygen support here to keep her saturations over 90%. Last echocardiogram was in April 2013 with 55% ejection fraction and diastolic dysfunction. Echo does not have to be repeated at this time. We will check two more sets of cardiac enzymes to rule out acute coronary syndrome. The patient is not on any beta blockers at home. We will start her on Coreg low dose and closely monitor her heart rate and blood pressure. Start her on aspirin. I have also counseled the patient regarding fluid restriction which she did not know. She will be on 2 liters fluid restriction. We will keep a close eye on her renal function as she has chronic kidney disease, stage 4, and we have to monitor for any creatinine worsening. If her kidney function does worsen, we will consult Nephrology, Dr. Wynelle Link, who knows the patient.  2. Hypertension, uncontrolled: Coreg is being started at this time. We will use IV medications as needed to control the blood pressure.  3. Hyperkalemia: This is secondary to her chronic kidney disease. With the patient being on Lasix, this should slowly improve. No EKG changes, and hyperkalemia is mild.  4. Dementia: The patient is high risk for sundowning and inpatient delirium. She will be on ambulation with assist  with fall precautions.  5. Deep venous thrombosis prophylaxis with heparin.  CODE STATUS: DO NOT RESUSCITATE, DO NOT INTUBATE.   TIME SPENT: Time spent today on this case was 65 minutes with more than 50% time spent in coordination of care.   ____________________________ Molinda Bailiff. Dejon Lukas, MD srs:cbb D: 12/18/2011 16:44:26 ET T: 12/18/2011 17:22:00 ET JOB#: [id number]  cc: Wardell Heath R. Hanifah Royse, MD, <Dictator> Rhona Leavens. Burnett Sheng, MD Laverda Sorenson, MD Orie Fisherman MD ELECTRONICALLY SIGNED 12/18/2011 23:02

## 2014-07-29 NOTE — Discharge Summary (Signed)
PATIENT NAME:  Erin Horton, Erin Horton MR#:  [id number] DATE OF BIRTH:  1919/12/15  DATE OF ADMISSION:  07/15/2011 DATE OF DISCHARGE:  07/20/2011  REASON FOR ADMISSION: Transient episode of slurred speech as well as confusion.  CONSULTATIONS: Nephrology, Dr. Wynelle LinkKolluru  DISCHARGE DIAGNOSES: 1. Slurred speech, possible transient ischemic attack.  2. Acute renal failure on chronic kidney disease stage IV.  3. Hypertension.  4. Recent diagnosis of shingles.  5. Hypothyroidism.  6. Hyperlipidemia. 7. History of transient ischemic attack, cerebrovascular accident.   HOME MEDICATIONS:  1. Synthroid 75 mcg p.o. every other day alternating with 50 mcg every other day.  2. Norvasc 5 mg p.o. daily.  3. Valacyclovir 1 gram p.o. every eight hours.  4. Cartia XT 180 mg p.o. daily.  5. Tramadol 50 mg p.o. once daily.  Additional medications: 6. Aspirin 81 mg p.o. daily.  7. Zocor 10 mg p.o. daily.  8. Hydralazine 25 mg p.o. q.i.d.  9. Sodium bicarbonate 325 mg p.o. t.i.d.    Holding home medications Lasix and potassium.   DISCHARGE INSTRUCTIONS: DIET: Low sodium, renal diet.   ACTIVITY: As tolerated.   FOLLOW-UP CARE:   1. Follow up with primary care physician within one week, Dr. Burnett ShengHedrick.  2. Follow up with nephrology, Dr. Wynelle LinkKolluru within one week.  HOSPITAL COURSE:   1. The patient is a 81 year old Caucasian female with a history of hypertension, hypothyroidism, peripheral vascular disease, chronic kidney disease, and osteoarthritis who presented to the ED with transient episode of slurred speech as well as confusion.  Her symptoms resolved within half an hour from when they started. When the patient was in the Emergency Department  the patient was back to her baseline and just said she was feeling bad.  For detailed history and physical examination, please refer to the admission note dictated by Dr. Auburn BilberryShreyang Patel. The patient was admitted for slurred speech and confusion, possible transient  ischemic attack. After admission the patient was treated with aspirin and she was kept on the telemetry. CT scan of head showed no acute abnormality. EKG showed sinus rhythm without ST-T wave changes. X-ray showed no acute abnormality.  The patient's labs on admission showed glucose 114, BUN 63, creatinine 2.59, sodium 144, potassium 3.4, chloride 20, anion gap 13. Troponin less than 0.02, TSH 21. WBC 3.9, hemoglobin 11.8. After admission the patient had carotid duplex, which was negative. In addition, the patient's echocardiogram  showed normal ejection fraction, more than 55%.  2. For acute renal failure on chronic kidney disease, after admission the patient's BUN increased to 60 and creatinine increased to 2.93, so Lasix was on hold and nephrology consult was requested. In addition, the patient had been given half-normal saline IV. Dr. Wynelle LinkKolluru evaluated the patient and recommended kidney ultrasound, which showed chronic kidney disease. In addition he add bicarb for acidosis. The patient's kidney function has been improving. BUN decreased to 46, creatinine decreased to 2.44. According to Dr. Wynelle LinkKolluru the patient can follow up as an outpatient within one week.  3. The patient has a recent diagnosis of shingles, which has been treated with valacyclovir. Shingles have been improving.  4. For hypertension the patient has been treated [place] and Norvasc. Hydralazine 25 mg p.o. q.i.d. was added. Blood pressure has been controlled. 5. The patient was found to have hyperlipidemia. LDL is 158, so she has been treated with Zocor 10 mg at bedtime. 6. The patient has weakness and difficulty walking so physical therapy consult was requested.  They recommended Home  Health with PT after discharge.  VITAL SIGNS: Today temperature is 98.1, blood pressure 132/56, pulse 73, O2 saturation 94% on room air.   PHYSICAL EXAMINATION:  Unremarkable except healing shingles.   DISPOSITION: The patient complains of poor appetite but  the patient is clinically stable and will be discharged to home with Home Health and PT today. I discussed the patient's discharge plan with the case manager Maxine Glenn and the patient's son.   TIME SPENT: About 40 minutes.   ____________________________ Shaune Pollack, MD qc:bjt D: 07/20/2011 11:05:44 ET T: 07/21/2011 10:54:31 ET JOB#: [id number]  cc: Shaune Pollack, MD, <Dictator> Rhona Leavens. Burnett Sheng, MD Shaune Pollack MD ELECTRONICALLY SIGNED 07/21/2011 17:32

## 2014-07-29 NOTE — H&P (Signed)
PATIENT NAME:  Erin Horton, Erin Horton MR#:  [id number] DATE OF BIRTH:  Dec 09, 1919  DATE OF ADMISSION:  07/15/2011  CHIEF COMPLAINT: Transient episode of slurred speech as well as confusion.   HISTORY OF PRESENT ILLNESS: The patient is a 81 year old white female with previous history of hypertension, hypothyroidism, osteoporosis, osteoarthritis, glaucoma, and peripheral vascular disease who lives apparently at home alone who earlier today was noted by family to have slurred speech which lasted about a half hour as well as was confused. Her symptoms resolved within a half hour of when they started. Currently the patient is back to baseline and she reports that she just "feels bad". She is not able to tell me why she feels bad. She is not having any chest pain. No shortness of breath. She is a little nauseous but no vomiting or diarrhea. The patient reports that she was diagnosed with shingles on her right abdomen and has been treated with valacyclovir. Otherwise, she denies any urinary symptoms including burning, frequency, or urgency. She denies any other asymmetrical weakness. No difficulty with walking.   PAST MEDICAL HISTORY:  1. Benign hypertension.  2. Hypothyroidism.  3. Osteoporosis.  4. Osteoarthritis.  5. Glaucoma.  6. Venostasis with peripheral edema.  7. Peripheral vascular disease, status post right lower extremity bypass grafting.  8. History of chronic anemia, followed by Hematology.  9. Chronic renal failure, likely stage II to III.  10. Status post hysterectomy.  11. Status post appendectomy.   ALLERGIES: Listed as allergy to "nuts" as well as coconut.   CURRENT MEDICATIONS:  1. Valacyclovir 1 gram p.o. q.8.  2. Tramadol 50 daily.  3. Synthroid 75 mcg every other day alternating with 50 mcg.  4. Potassium 10 mEq 1 tab p.o. daily.  5. Norvasc 5 daily.  6. Lasix 40 one tab p.o. daily.  7. Cartia XT 180 mg daily.   SOCIAL HISTORY: No smoking. No alcohol. No drugs. Lives alone  according to her.   FAMILY HISTORY: Positive for hypertension and diabetes.   REVIEW OF SYSTEMS: CONSTITUTIONAL: Denies any fevers. Complains of fatigue, weakness. No pain. No weight loss. No weight gain. EYES: No blurred or double vision. No pain. No redness. No inflammation. No glaucoma. No cataracts. ENT: No tinnitus. No ear pain. No hearing loss. No seasonal or year-round allergies. No epistaxis. No nasal discharge. No snoring. No postnasal drip. No sinus pains. RESPIRATORY: No cough. No wheezing. No hemoptysis. No dyspnea. No asthma. No painful respirations. CARDIOVASCULAR: No chest pain. No orthopnea. No edema. No arrhythmia. No dyspnea. No palpitations. No syncope. GI: Complains of some nausea but no vomiting, diarrhea. No abdominal pain. No hematemesis. No melena. No ulcer. No gastroesophageal reflux disease. No irritable bowel syndrome. No jaundice. No rectal bleeding. GU: Denies any dysuria, hematuria, renal calculus, or frequency. ENDOCRINE: Denies any polyuria, nocturia. Does have hypothyroidism. Denies any increase in sweating, heat or cold intolerance. HEME/LYMPH: Denies anemia, easy bruisability or bleeding. SKIN: No acne. Has a rash related to herpes zoster on the abdomen. MUSCULOSKELETAL: No pain in the neck, back, or shoulder. No gout. NEUROLOGIC: No numbness. Complains of weakness. No history of CVA or TIA. PSYCHIATRIC: No anxiety. No insomnia. No ADD. No OCD.   PHYSICAL EXAMINATION:   VITAL SIGNS: Temperature 98.4, pulse 79, respirations 18, blood pressure 197/89, O2 96% on room air.   GENERAL: The patient is an elderly white female in no acute distress.   HEENT: Head atraumatic, normocephalic. Pupils equally round, reactive to light and accommodation. Extraocular  movements intact. Conjunctivae there is no scleral icterus. No conjunctival pallor. Oropharynx is clear without any exudates.   NECK: There is no thyromegaly. No carotid bruits.   CARDIOVASCULAR: Regular rate and rhythm.  No murmurs, rubs, clicks, or gallops. PMI is not displaced.   LUNGS: Clear to auscultation bilaterally without any rales, rhonchi, or wheezing.   ABDOMEN: Soft, nontender, nondistended. She has on the right abdomen a rash related to her zoster which appears severe.   EXTREMITIES: There is no clubbing, cyanosis, or edema.   LYMPHATICS: No lymph nodes palpable.   MUSCULOSKELETAL: There is no erythema or swelling.   VASCULAR: Good DP, PT pulses.   PSYCHIATRIC: Not anxious or depressed.   NEUROLOGICAL: Awake, alert, oriented x3. Cranial nerves II through XII grossly intact. No focal deficits. Strength 5 out of 5. Reflexes 2+.  EVALUATIONS IN THE ED: Glucose 114, BUN 63, creatinine 2.59, sodium 141, potassium 3.4, chloride 20, anion gap 13. LFTs are normal. CPK 69. CK-MB 3.8. Troponin less than 0.02. TSH 21. WBC 3.9. Hemoglobin 11.8. Urinalysis shows RBCs 52, leukocyte esterase trace.   CT scan of the head shows no acute abnormality.   EKG normal sinus rhythm without any ST-T wave changes.   Chest x-ray no acute abnormality.   ASSESSMENT AND PLAN: The patient is a 81 year old white female with history of hypertension, dementia, and hypothyroidism brought in by family for episode of confusion and slurred speech.  1. Slurred speech and episode of confusion, possibly TIA. At this time her symptoms have resolved. We will place her on aspirin. Keep her on telemetry. Get echocardiogram of the heart as well as carotid Doppler's.  2. Recent diagnosis of shingles. Will place her on isolation contact precautions. Continue valacyclovir as taking at home.  3. Hypertension. Continue Cartia and Norvasc.  4. Hypothyroidism. TSH is elevated. Will increase Synthroid dose.  5. Chronic renal failure, appears to be stable. Continue to monitor.  6. Miscellaneous. Will place her on Lovenox for DVT prophylaxis.   TIME SPENT: 35 minutes.    ____________________________ Lacie ScottsShreyang H. Allena KatzPatel,  MD shp:drc D: 07/15/2011 15:07:32 ET T: 07/15/2011 15:22:02 ET JOB#: [id number]  cc: Shakai Dolley H. Allena KatzPatel, MD, <Dictator> Charise CarwinSHREYANG H Avalene Sealy MD ELECTRONICALLY SIGNED 07/18/2011 16:34

## 2014-12-17 ENCOUNTER — Other Ambulatory Visit: Payer: Self-pay | Admitting: Cardiovascular Disease

## 2015-01-15 ENCOUNTER — Ambulatory Visit: Payer: Self-pay | Admitting: Cardiovascular Disease

## 2015-02-04 ENCOUNTER — Encounter: Payer: Self-pay | Admitting: Cardiovascular Disease

## 2015-02-04 ENCOUNTER — Ambulatory Visit (INDEPENDENT_AMBULATORY_CARE_PROVIDER_SITE_OTHER): Payer: Medicare Other | Admitting: Cardiovascular Disease

## 2015-02-04 VITALS — BP 136/64 | HR 73 | Ht 61.0 in | Wt 92.0 lb

## 2015-02-04 DIAGNOSIS — Z23 Encounter for immunization: Secondary | ICD-10-CM

## 2015-02-04 DIAGNOSIS — I4892 Unspecified atrial flutter: Secondary | ICD-10-CM | POA: Diagnosis not present

## 2015-02-04 DIAGNOSIS — I5032 Chronic diastolic (congestive) heart failure: Secondary | ICD-10-CM

## 2015-02-04 DIAGNOSIS — I5033 Acute on chronic diastolic (congestive) heart failure: Secondary | ICD-10-CM

## 2015-02-04 DIAGNOSIS — I10 Essential (primary) hypertension: Secondary | ICD-10-CM

## 2015-02-04 DIAGNOSIS — I6529 Occlusion and stenosis of unspecified carotid artery: Secondary | ICD-10-CM | POA: Insufficient documentation

## 2015-02-04 DIAGNOSIS — E785 Hyperlipidemia, unspecified: Secondary | ICD-10-CM

## 2015-02-04 DIAGNOSIS — E039 Hypothyroidism, unspecified: Secondary | ICD-10-CM

## 2015-02-04 DIAGNOSIS — R600 Localized edema: Secondary | ICD-10-CM

## 2015-02-04 DIAGNOSIS — I6523 Occlusion and stenosis of bilateral carotid arteries: Secondary | ICD-10-CM

## 2015-02-04 DIAGNOSIS — N186 End stage renal disease: Secondary | ICD-10-CM

## 2015-02-04 MED ORDER — CARVEDILOL 3.125 MG PO TABS: 3.1250 mg | ORAL_TABLET | Freq: Two times a day (BID) | ORAL | Status: AC

## 2015-02-04 MED ORDER — HYDRALAZINE HCL 100 MG PO TABS: 100.0000 mg | ORAL_TABLET | Freq: Three times a day (TID) | ORAL | Status: AC

## 2015-02-04 NOTE — Assessment & Plan Note (Signed)
Currently not on Lasix. Minimal ankle edema No changes to her medications

## 2015-02-04 NOTE — Assessment & Plan Note (Signed)
Bruit appreciated on exam. Family has declined ultrasound and workup at this time. We'll manage conservatively

## 2015-02-04 NOTE — Assessment & Plan Note (Signed)
Basic metabolic panel drawn today

## 2015-02-04 NOTE — Progress Notes (Signed)
Patient ID: Erin Horton, female    DOB: [birth year], 81 y.o.   MRN: [id number]  HPI Comments: Erin Horton is a pleasant 81 year old woman with chronic kidney disease, stage IV, dementia, history of TIA, peripheral vascular disease with right lower extremity bypass grafting with  hospital admission 12/18/2011 to [facility] with acute on chronic diastolic CHF in the setting of worsening renal failure, atrial flutter.  She presents today for follow-up of her diastolic CHF  She presents with family. Weight continues to trend downwards. Now 92 pounds, previously 97 pounds and last year was 105 pounds She is not eating much. Minimal ankle edema. She's not taking Lasix Walks with a walker, very unsteady. No recent lab work available. Overall in good spirits. Family denies any significant shortness of breath at rest  EKG on today's visit shows normal sinus rhythm with rate 73 bpm, significant baseline artifact  Other past medical history Prior creatinine 3.0 Baseline anemia in 2015 On previous visit had severe leg edema, shortness of breath In the hospital, she was found to have mild to moderate bilateral pleural effusions she had significant diuresis in the hospital, approximately 6 L or more. Renal function was essentially stable through this.  Baseline creatinine 2.0, BNP in the hospital was 1400  Echocardiogram in the hospital showed ejection fraction 55% , diastolic dysfunction Chest x-ray in the hospital showed mild-to-moderate pulmonary effusions bilaterally Amlodipine held previously with improvement of her leg edema  Previous EKG in the hospital 12/18/2011 appears to show atrial flutter with ventricular rate 73 beats per minute EKG on last office visit showed normal sinus rhythm with rate 62 beats per minute with no significant ST or T wave changes      Allergies  Allergen Reactions  . Donepezil Hcl     Other reaction(s): Unknown  . Memantine     Other reaction(s): Unknown     Outpatient Encounter Prescriptions as of 02/04/2015  Medication Sig  . aspirin 81 MG tablet Take 81 mg by mouth daily.  . carvedilol (COREG) 3.125 MG tablet Take 1 tablet (3.125 mg total) by mouth 2 (two) times daily with a meal.  . furosemide (LASIX) 40 MG tablet Take 1 tablet (40 mg total) by mouth daily as needed.  . hydrALAZINE (APRESOLINE) 100 MG tablet Take 1 tablet (100 mg total) by mouth 3 (three) times daily.  Marland Kitchen. levothyroxine (SYNTHROID, LEVOTHROID) 75 MCG tablet Take 75 mcg by mouth daily.   . sodium bicarbonate 650 MG tablet Take 1 tablet (650 mg total) by mouth 3 (three) times daily.  . [DISCONTINUED] carvedilol (COREG) 3.125 MG tablet TAKE ONE TABLET BY MOUTH TWICE DAILY WITH A MEAL  . [DISCONTINUED] hydrALAZINE (APRESOLINE) 100 MG tablet TAKE ONE TABLET BY MOUTH 3 TIMES DAILY  . [DISCONTINUED] ALPRAZolam (XANAX) 0.25 MG tablet Take 0.25 mg by mouth at bedtime as needed.   . [DISCONTINUED] Melatonin 5 MG TABS Take by mouth at bedtime.   No facility-administered encounter medications on file as of 02/04/2015.    Past Medical History  Diagnosis Date  . Hypertension   . CHF (congestive heart failure) (HCC)   . Transient ischemic attack   . Chronic kidney disease   . Hypothyroidism   . Hyperlipidemia   . Alzheimer's dementia   . Osteoarthritis   . Glaucoma   . Peripheral vascular disease (HCC)     with right lower extremity bypass graft    Past Surgical History  Procedure Laterality Date  . Bypass graft  right lower extremity  . Abdominal hysterectomy    . Appendectomy      Social History  reports that she has never smoked. She does not have any smokeless tobacco history on file. She reports that she does not drink alcohol or use illicit drugs.  Family History Family history is unknown by patient.      Review of Systems  Constitutional: Positive for fatigue.  HENT: Negative.   Eyes: Negative.   Respiratory: Negative.   Cardiovascular: Negative.    Gastrointestinal: Negative.   Endocrine: Negative.   Musculoskeletal: Positive for gait problem.  Skin: Negative.   Allergic/Immunologic: Negative.   Neurological: Negative.   Hematological: Negative.   Psychiatric/Behavioral: Negative.   All other systems reviewed and are negative.   BP 136/64 mmHg  Pulse 73  Ht  (1.549 m)  Wt 92 lb (41.731 kg)  BMI 17.39 kg/m2  Physical Exam  Constitutional: She is oriented to person, place, and time. She appears well-developed and well-nourished.  HENT:  Head: Normocephalic.  Nose: Nose normal.  Mouth/Throat: Oropharynx is clear and moist.  Eyes: Conjunctivae are normal. Pupils are equal, round, and reactive to light.  Neck: Normal range of motion. Neck supple. No JVD present. Carotid bruit is present.  Cardiovascular: Normal rate, regular rhythm, S1 normal, S2 normal and intact distal pulses.  Exam reveals no gallop and no friction rub.   Murmur heard.  Systolic murmur is present with a grade of 2/6  Pulmonary/Chest: Effort normal and breath sounds normal. No respiratory distress. She has no wheezes. She has no rales. She exhibits no tenderness.  Abdominal: Soft. Bowel sounds are normal. She exhibits no distension. There is no tenderness.  Musculoskeletal: Normal range of motion. She exhibits no edema or tenderness.  Lymphadenopathy:    She has no cervical adenopathy.  Neurological: She is alert and oriented to person, place, and time. Coordination normal.  Skin: Skin is warm and dry. No rash noted. No erythema.  Psychiatric: She has a normal mood and affect. Her behavior is normal. Judgment and thought content normal.    Assessment and Plan  Nursing note and vitals reviewed.

## 2015-02-04 NOTE — Assessment & Plan Note (Signed)
Blood pressure is well controlled on today's visit. No changes made to the medications. Medications renewed

## 2015-02-04 NOTE — Assessment & Plan Note (Signed)
Etiology of her edema is unclear, possibly diastolic CHF though exacerbated by renal dysfunction and anemia Minimal at this time No further workup

## 2015-02-04 NOTE — Assessment & Plan Note (Signed)
Discussed elevated cholesterol with family. She does have a carotid bruit. They prefer watchful waiting, periodic evaluation. Declined ultrasound or statin

## 2015-02-04 NOTE — Patient Instructions (Addendum)
You are doing well. No medication changes were made.  We will check labs today: CBC, BMP, TSH Flu shot  Please call us if you have new issues that need to be addressed before your next appt.  Your physician wants you to follow-up in: 6 months.  You will receive a reminder letter in the mail two months in advance. If you don't receive a letter, please call our office to schedule the follow-up appointment.

## 2015-11-05 DEATH — deceased
